# Patient Record
Sex: Female | Born: 1985 | Race: White | Hispanic: No | Marital: Single | Smoking: Never smoker
Health system: Southern US, Community
[De-identification: ages and names within clinical notes are randomized; demographics above are authoritative.]

## PROBLEM LIST (undated history)

## (undated) DIAGNOSIS — F411 Generalized anxiety disorder: Secondary | ICD-10-CM

## (undated) DIAGNOSIS — J309 Allergic rhinitis, unspecified: Secondary | ICD-10-CM

## (undated) DIAGNOSIS — R21 Rash and other nonspecific skin eruption: Secondary | ICD-10-CM

## (undated) DIAGNOSIS — F319 Bipolar disorder, unspecified: Secondary | ICD-10-CM

## (undated) DIAGNOSIS — J019 Acute sinusitis, unspecified: Secondary | ICD-10-CM

## (undated) HISTORY — DX: Generalized anxiety disorder: F41.1

## (undated) HISTORY — DX: Rash and other nonspecific skin eruption: R21

## (undated) HISTORY — DX: Bipolar disorder, unspecified: F31.9

## (undated) HISTORY — DX: Allergic rhinitis, unspecified: J30.9

## (undated) HISTORY — DX: Acute sinusitis, unspecified: J01.90

---
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
## 2005-01-11 ENCOUNTER — Ambulatory Visit: Payer: Self-pay | Admitting: Internal Medicine

## 2006-01-08 IMAGING — CR DG FOREARM 2V*R*
2 series · 2 of 2 positions shown · non-contrast
Comparison: none

CLINICAL DATA: Hit with softball in forearm with swelling. 
 RIGHT FOREARM, TWO VIEW ? 02/16/04
 There is no evidence of fracture or dislocation. No other significant bone or soft tissue abnormalities are identified.

 IMPRESSION
 Normal study.

[view not recorded (1 of 2)]
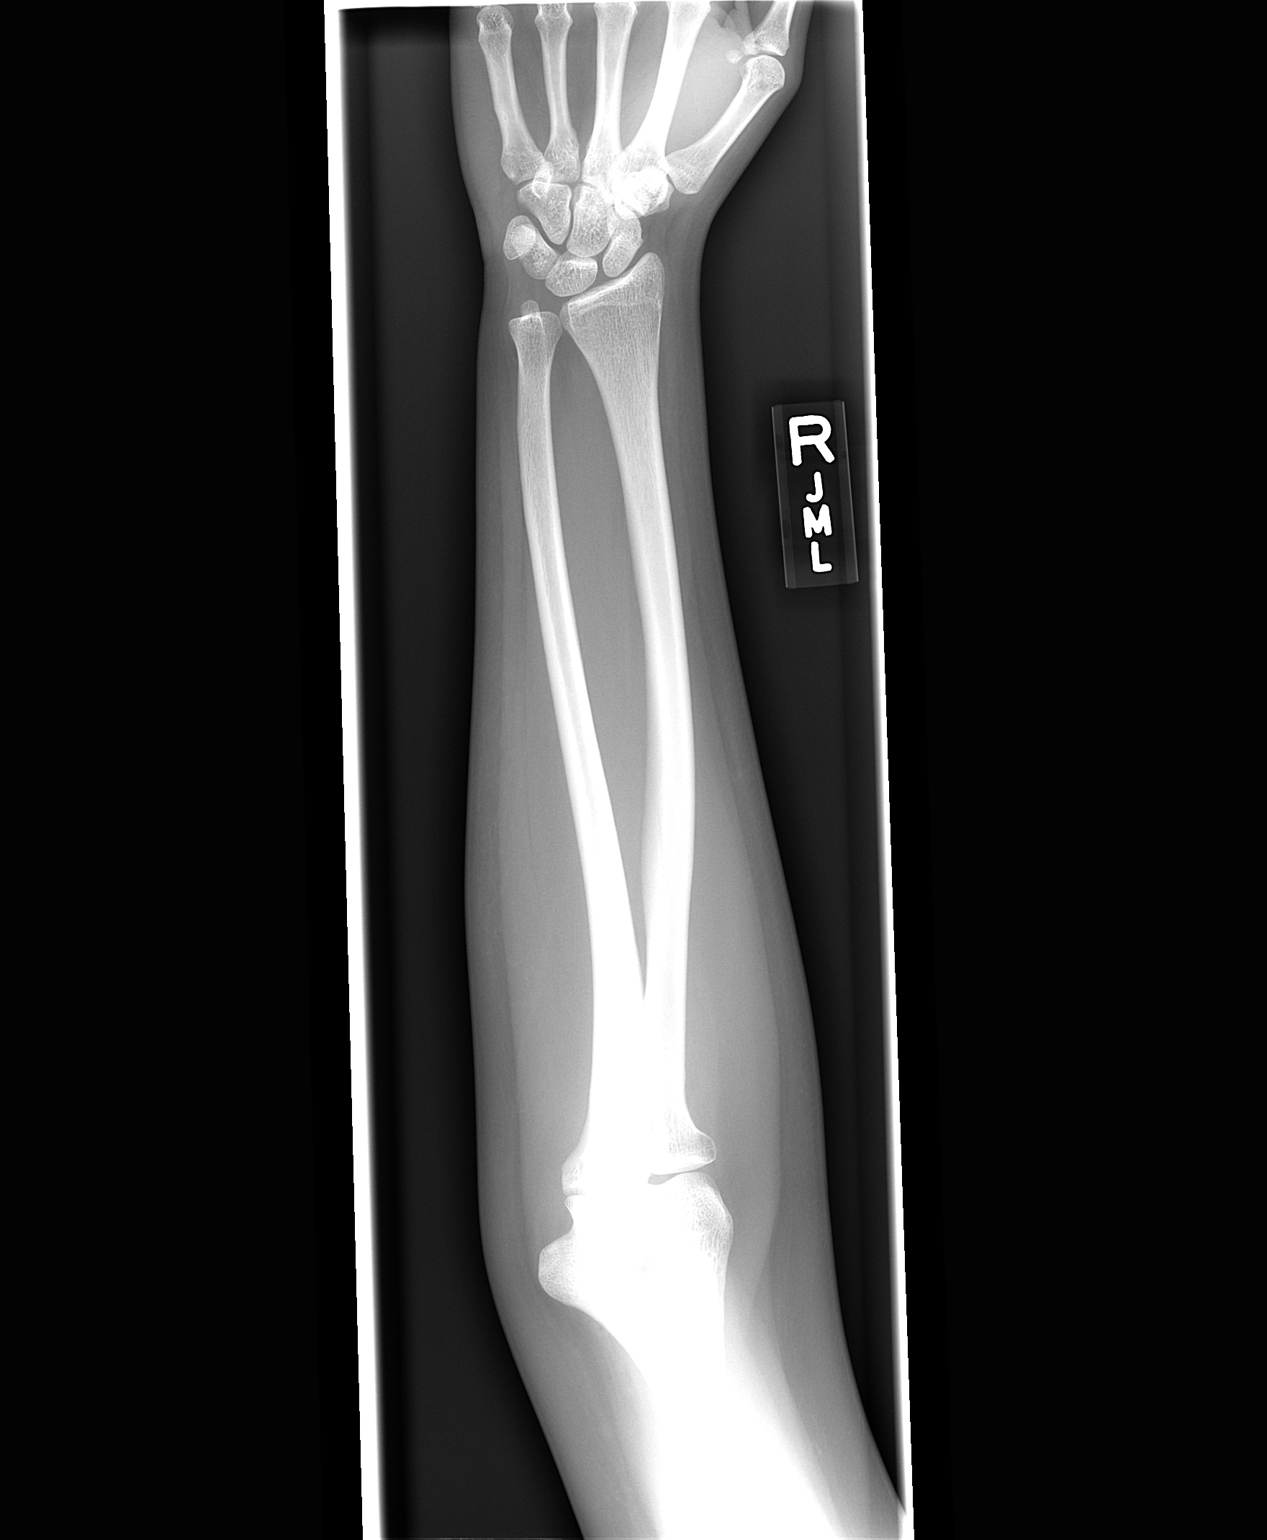

[view not recorded (2 of 2)]
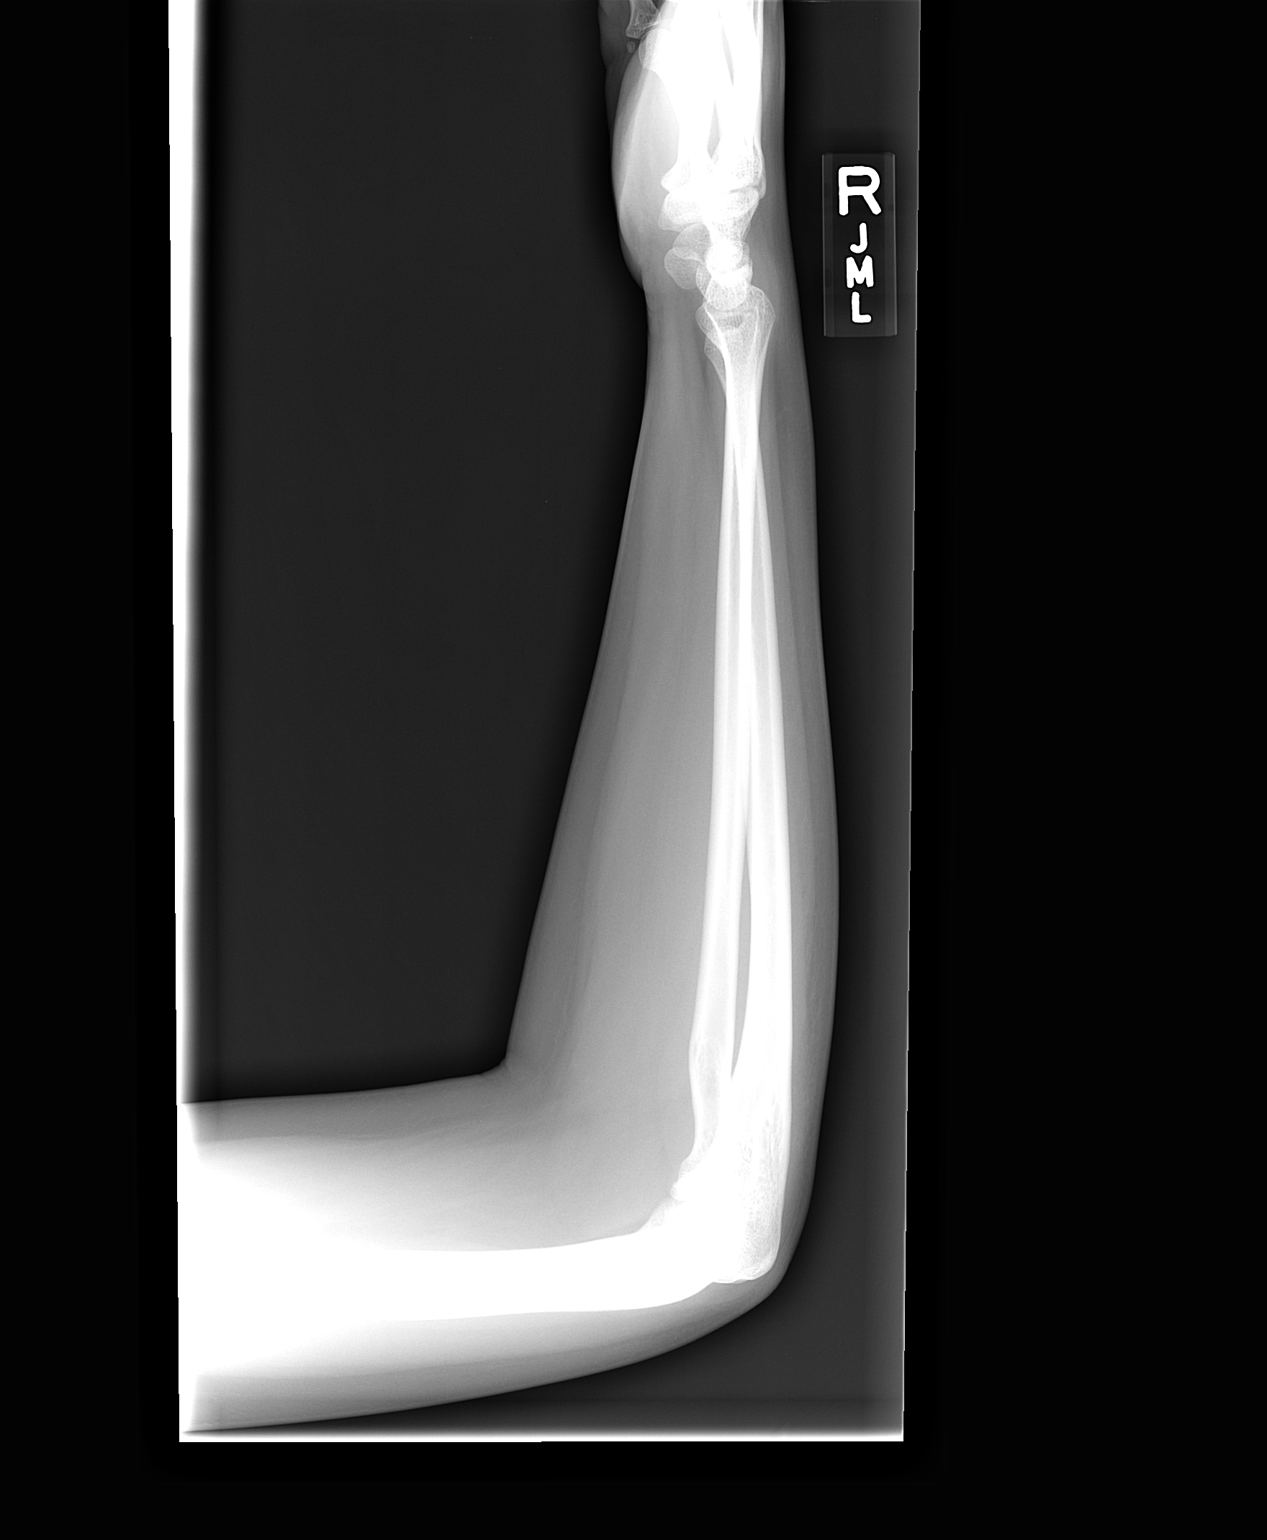

[2 of 2 positions shown; findings below may reference images not displayed]

## 2006-06-14 ENCOUNTER — Ambulatory Visit: Payer: Self-pay | Admitting: Internal Medicine

## 2008-06-18 ENCOUNTER — Ambulatory Visit: Payer: Self-pay | Admitting: Internal Medicine

## 2008-06-18 DIAGNOSIS — J309 Allergic rhinitis, unspecified: Secondary | ICD-10-CM

## 2008-06-18 DIAGNOSIS — F411 Generalized anxiety disorder: Secondary | ICD-10-CM

## 2008-06-18 DIAGNOSIS — R21 Rash and other nonspecific skin eruption: Secondary | ICD-10-CM | POA: Insufficient documentation

## 2008-06-18 HISTORY — DX: Allergic rhinitis, unspecified: J30.9

## 2008-06-18 HISTORY — DX: Generalized anxiety disorder: F41.1

## 2008-06-18 HISTORY — DX: Rash and other nonspecific skin eruption: R21

## 2008-12-24 ENCOUNTER — Ambulatory Visit: Payer: Self-pay | Admitting: Internal Medicine

## 2009-07-09 ENCOUNTER — Ambulatory Visit: Payer: Self-pay | Admitting: Internal Medicine

## 2009-07-09 DIAGNOSIS — J019 Acute sinusitis, unspecified: Secondary | ICD-10-CM

## 2009-07-09 HISTORY — DX: Acute sinusitis, unspecified: J01.90

## 2010-07-01 NOTE — Assessment & Plan Note (Signed)
Summary: sinus inf? cd   Vital Signs:  Patient profile:   25 year old female Height:      67 inches Weight:      164.38 pounds BMI:     25.84 O2 Sat:      97 % on Room air Temp:     98.8 degrees F oral Pulse rate:   126 / minute BP sitting:   112 / 76  (left arm) Cuff size:   regular  Vitals Entered ByZella Ball Ewing (July 09, 2009 2:19 PM)  O2 Flow:  Room air CC: pt c/o sinuses/pressure on left side of face radiating into teeth/congestion/re   CC:  pt c/o sinuses/pressure on left side of face radiating into teeth/congestion/re.  History of Present Illness: here with 2 wks grad increasing facial pain, pressure, fever and greenish d/c, with teeth pain to the left face and jaw area;  Pt denies CP, sob, doe, wheezing, orthopnea, pnd, worsening LE edema, palps, dizziness or syncope  Pt denies new neuro symptoms such as headache, facial or extremity weakness   Problems Prior to Update: 1)  Sinusitis- Acute-nos  (ICD-461.9) 2)  Allergic Rhinitis  (ICD-477.9) 3)  Anxiety  (ICD-300.00) 4)  Rash-nonvesicular  (ICD-782.1)  Medications Prior to Update: 1)  Alprazolam 1 Mg Tabs (Alprazolam) .... 1/2 - 1 By Mouth Three Times A Day As Needed 2)  Doxycycline Hyclate 100 Mg Caps (Doxycycline Hyclate) .Marland Kitchen.. 1po Two Times A Day 3)  Ofloxacin 0.3 % Soln (Ofloxacin) .... 2 Gtt Both Eyes Four Times Per Day For 10 Days  Current Medications (verified): 1)  Cephalexin 500 Mg Caps (Cephalexin) .Marland Kitchen.. 1po Three Times A Day  Allergies (verified): 1)  ! * Mycins  Past History:  Past Medical History: Last updated: 06/18/2008 Anxiety Bipolar illness Allergic rhinitis  Past Surgical History: Last updated: 06/18/2008 Denies surgical history  Social History: Last updated: 06/18/2008 Never Smoked Alcohol use-no Single to graduate [employer] spring 2010  Risk Factors: Smoking Status: never (06/18/2008)  Review of Systems       all otherwise negative per pt -  Physical Exam  General:   alert and well-developed.  , mild ill  Head:  normocephalic and atraumatic.   Eyes:  vision grossly intact, pupils equal, and pupils round.   Ears:  bilat tm's red, sinus tender bilat left > right Nose:  nasal dischargemucosal pallor and mucosal erythema.   Mouth:  pharyngeal erythema and fair dentition.   Neck:  supple and cervical lymphadenopathy.   Breasts:  no abnormal thickening.   Lungs:  normal respiratory effort and normal breath sounds.   Heart:  normal rate and regular rhythm.   Extremities:  no edema, no erythema    Impression & Recommendations:  Problem # 1:  SINUSITIS- ACUTE-NOS (ICD-461.9)  The following medications were removed from the medication list:    Doxycycline Hyclate 100 Mg Caps (Doxycycline hyclate) .Marland Kitchen... 1po two times a day Her updated medication list for this problem includes:    Cephalexin 500 Mg Caps (Cephalexin) .Marland Kitchen... 1po three times a day treat as above, f/u any worsening signs or symptoms   Complete Medication List: 1)  Cephalexin 500 Mg Caps (Cephalexin) .Marland Kitchen.. 1po three times a day  Patient Instructions: 1)  Please take all new medications as prescribed 2)  Continue all previous medications as before this visit  3)  Please schedule a follow-up appointment as needed. Prescriptions: CEPHALEXIN 500 MG CAPS (CEPHALEXIN) 1po three times a day  #30 x 0  Entered and Authorized by:   Corwin Levins MD   Signed by:   Corwin Levins MD on 07/09/2009   Method used:   Print then Give to Patient   RxID:   [id number] CLARITHROMYCIN 500 MG TABS (CLARITHROMYCIN) 1 by mouth two times a day  #20 x 0   Entered and Authorized by:   Corwin Levins MD   Signed by:   Corwin Levins MD on 07/09/2009   Method used:   Print then Give to Patient   RxID:   [id number]

## 2010-10-15 ENCOUNTER — Encounter: Payer: Self-pay | Admitting: Internal Medicine

## 2010-10-15 DIAGNOSIS — Z0000 Encounter for general adult medical examination without abnormal findings: Secondary | ICD-10-CM | POA: Insufficient documentation

## 2010-10-18 ENCOUNTER — Encounter: Payer: Self-pay | Admitting: Internal Medicine

## 2010-10-18 ENCOUNTER — Other Ambulatory Visit (INDEPENDENT_AMBULATORY_CARE_PROVIDER_SITE_OTHER): Payer: BC Managed Care – PPO

## 2010-10-18 ENCOUNTER — Ambulatory Visit (INDEPENDENT_AMBULATORY_CARE_PROVIDER_SITE_OTHER): Payer: BC Managed Care – PPO | Admitting: Internal Medicine

## 2010-10-18 VITALS — BP 90/62 | HR 82 | Temp 97.6°F | Ht 67.0 in | Wt 164.0 lb

## 2010-10-18 DIAGNOSIS — Z0000 Encounter for general adult medical examination without abnormal findings: Secondary | ICD-10-CM

## 2010-10-18 DIAGNOSIS — R233 Spontaneous ecchymoses: Secondary | ICD-10-CM | POA: Insufficient documentation

## 2010-10-18 DIAGNOSIS — R238 Other skin changes: Secondary | ICD-10-CM | POA: Insufficient documentation

## 2010-10-18 DIAGNOSIS — M7710 Lateral epicondylitis, unspecified elbow: Secondary | ICD-10-CM | POA: Insufficient documentation

## 2010-10-18 DIAGNOSIS — J309 Allergic rhinitis, unspecified: Secondary | ICD-10-CM

## 2010-10-18 LAB — URINALYSIS, ROUTINE W REFLEX MICROSCOPIC
Bilirubin Urine: NEGATIVE
Hgb urine dipstick: NEGATIVE
Nitrite: NEGATIVE
Total Protein, Urine: NEGATIVE
Urine Glucose: NEGATIVE
pH: 7.5 (ref 5.0–8.0)

## 2010-10-18 LAB — BASIC METABOLIC PANEL WITH GFR
BUN: 9 mg/dL (ref 6–23)
CO2: 28 meq/L (ref 19–32)
Calcium: 9.4 mg/dL (ref 8.4–10.5)
Chloride: 103 meq/L (ref 96–112)
Creatinine, Ser: 0.5 mg/dL (ref 0.4–1.2)
GFR: 156.58 mL/min
Glucose, Bld: 82 mg/dL (ref 70–99)
Potassium: 4.2 meq/L (ref 3.5–5.1)
Sodium: 138 meq/L (ref 135–145)

## 2010-10-18 LAB — PROTIME-INR
INR: 1.1 ratio — ABNORMAL HIGH (ref 0.8–1.0)
Prothrombin Time: 11.8 s (ref 10.2–12.4)

## 2010-10-18 LAB — CBC WITH DIFFERENTIAL/PLATELET
Basophils Absolute: 0 10*3/uL (ref 0.0–0.1)
Eosinophils Relative: 1.1 % (ref 0.0–5.0)
HCT: 37.9 % (ref 36.0–46.0)
Hemoglobin: 13.1 g/dL (ref 12.0–15.0)
Lymphocytes Relative: 24 % (ref 12.0–46.0)
Monocytes Relative: 7.2 % (ref 3.0–12.0)
Platelets: 186 10*3/uL (ref 150.0–400.0)
RDW: 12.8 % (ref 11.5–14.6)
WBC: 6 10*3/uL (ref 4.5–10.5)

## 2010-10-18 LAB — TSH: TSH: 1.14 u[IU]/mL (ref 0.35–5.50)

## 2010-10-18 LAB — HEPATIC FUNCTION PANEL
ALT: 14 U/L (ref 0–35)
AST: 16 U/L (ref 0–37)
Albumin: 4.1 g/dL (ref 3.5–5.2)
Alkaline Phosphatase: 50 U/L (ref 39–117)
Bilirubin, Direct: 0.1 mg/dL (ref 0.0–0.3)
Total Bilirubin: 1 mg/dL (ref 0.3–1.2)
Total Protein: 6.6 g/dL (ref 6.0–8.3)

## 2010-10-18 LAB — LIPID PANEL: Total CHOL/HDL Ratio: 2

## 2010-10-18 LAB — APTT: aPTT: 27.3 s (ref 21.7–28.8)

## 2010-10-18 MED ORDER — FEXOFENADINE HCL 180 MG PO TABS: 180.0000 mg | ORAL_TABLET | Freq: Every day | ORAL | Status: DC

## 2010-10-18 MED ORDER — FLUTICASONE PROPIONATE 50 MCG/ACT NA SUSP: 2.0000 | Freq: Every day | NASAL | Status: DC

## 2010-10-18 NOTE — Assessment & Plan Note (Signed)

## 2010-10-18 NOTE — Assessment & Plan Note (Signed)
Mild, ? Clinical signficance - for check coags and cbc, consider bleeding time

## 2010-10-18 NOTE — Assessment & Plan Note (Signed)
Mild to mod, for allegra and flonase asd,  to f/u any worsening symptoms or concerns

## 2010-10-18 NOTE — Patient Instructions (Signed)
Take all new medications as prescribed - OTC alleve as needed for pain, as well as the allegra and flonase for allergies Continue all other medications as before Please go to LAB in the Basement for the blood and/or urine tests to be done today Please call the phone number [phone] (the [employer] System) for results of testing in 2-3 days;  When calling, simply dial the number, and when prompted enter the MRN number above (the Medical Record Number) and the # key, then the message should start.

## 2010-10-18 NOTE — Assessment & Plan Note (Signed)
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

## 2010-10-18 NOTE — Progress Notes (Signed)
Subjective:    Patient ID: Cynthia Pham, female    DOB: Dec 04, 1985, 25 y.o.   MRN: [id number]  HPI  Here for wellness and f/u;  Overall doing ok;  Pt denies CP, worsening SOB, DOE, wheezing, orthopnea, PND, worsening LE edema, palpitations, dizziness or syncope.  Pt denies neurological change such as new Headache, facial or extremity weakness.  Pt denies polydipsia, polyuria, or low sugar symptoms. Pt states overall good compliance with treatment and medications, good tolerability, and trying to follow lower cholesterol diet.  Pt denies worsening depressive symptoms, suicidal ideation or panic. No fever, wt loss, night sweats, loss of appetite, or other constitutional symptoms.  Pt states good ability with ADL's, low fall risk, home safety reviewed and adequate, no significant changes in hearing or vision, and occasionally active with exercise.  Works at [employer] - c/o pain to the right elbow and wrist, better and worse, currently resolved, but may be worse after work.  Also with unusual for her bruising to extremities of which she was unaware of how this occurred, though no other bleeding such as gums or urine, BRBPR or joints.  Does have several wks ongoing nasal allergy symptoms with clear congestion, itch and sneeze, without fever, pain, ST, cough or wheezing. Past Medical History  Diagnosis Date  . ALLERGIC RHINITIS 06/18/2008  . ANXIETY 06/18/2008    bipolar  . RASH-NONVESICULAR 06/18/2008  . SINUSITIS- ACUTE-NOS 07/09/2009   No past surgical history on file.  reports that she has never smoked. She does not have any smokeless tobacco history on file. She reports that she does not drink alcohol. Her drug history not on file. family history includes ADD / ADHD in her mother and sister; Alcohol abuse in her other; Anxiety disorder in her mother and sister; Depression in her mother and sister; Diabetes in her other; Hyperlipidemia in her other; Hypertension in her other; and Stroke in  her other. Allergies  Allergen Reactions  . Azithromycin Hives, Diarrhea and Nausea Only   Current Outpatient Prescriptions on File Prior to Visit  Medication Sig Dispense Refill  . DISCONTD: cephALEXin (KEFLEX) 500 MG capsule Take 500 mg by mouth 3 (three) times daily.         Review of Systems Review of Systems  Constitutional: Negative for diaphoresis, activity change, appetite change and unexpected weight change.  HENT: Negative for hearing loss, ear pain, facial swelling, mouth sores and neck stiffness.   Eyes: Negative for pain, redness and visual disturbance.  Respiratory: Negative for shortness of breath and wheezing.   Cardiovascular: Negative for chest pain and palpitations.  Gastrointestinal: Negative for diarrhea, blood in stool, abdominal distention and rectal pain.  Genitourinary: Negative for hematuria, flank pain and decreased urine volume.  Musculoskeletal: Negative for myalgias and joint swelling.  Skin: Negative for color change and wound.  Neurological: Negative for syncope and numbness.  Hematological: Negative for adenopathy.  Psychiatric/Behavioral: Negative for hallucinations, self-injury, decreased concentration and agitation.      Objective:   Physical Exam BP 90/62  Pulse 82  Temp(Src) 97.6 F (36.4 C) (Oral)  Ht 5\' 7"  (1.702 m)  Wt 164 lb (74.39 kg)  BMI 25.69 kg/m2  SpO2 97%  LMP 10/07/2010 Physical Exam  VS noted Constitutional: Pt is oriented to person, place, and time. Appears well-developed and well-nourished.  HENT:  Head: Normocephalic and atraumatic.  Right Ear: External ear normal.  Left Ear: External ear normal.  Nose: Nose normal.  Mouth/Throat: Oropharynx is clear and moist.  Bilat tm's mild erythema.  Sinus nontender.  Pharynx mild erythema Eyes: Conjunctivae and EOM are normal. Pupils are equal, round, and reactive to light.  Neck: Normal range of motion. Neck supple. No JVD present. No tracheal deviation present.  Cardiovascular:  Normal rate, regular rhythm, normal heart sounds and intact distal pulses.   Pulmonary/Chest: Effort normal and breath sounds normal.  Abdominal: Soft. Bowel sounds are normal. There is no tenderness.  Musculoskeletal: Normal range of motion. Exhibits no edema. Right elbow and wrist NT, FROM, no swelling Lymphadenopathy:  Has no cervical adenopathy.  Neurological: Pt is alert and oriented to person, place, and time. Pt has normal reflexes. No cranial nerve deficit.  Skin: Skin is warm and dry. No rash noted. Several healing purplish-brown bruises noted to legs and arms Psychiatric:  Has  normal mood and affect. Behavior is normal. 1+ nervous        Assessment & Plan:

## 2011-07-15 ENCOUNTER — Encounter (INDEPENDENT_AMBULATORY_CARE_PROVIDER_SITE_OTHER): Payer: BC Managed Care – PPO | Admitting: *Deleted

## 2011-07-15 ENCOUNTER — Other Ambulatory Visit: Payer: Self-pay | Admitting: Cardiology

## 2011-07-15 ENCOUNTER — Encounter: Payer: Self-pay | Admitting: Internal Medicine

## 2011-07-15 ENCOUNTER — Ambulatory Visit (INDEPENDENT_AMBULATORY_CARE_PROVIDER_SITE_OTHER): Payer: BC Managed Care – PPO | Admitting: Internal Medicine

## 2011-07-15 ENCOUNTER — Telehealth: Payer: Self-pay | Admitting: Internal Medicine

## 2011-07-15 VITALS — BP 100/70 | HR 109 | Temp 98.3°F | Resp 18 | Wt 161.2 lb

## 2011-07-15 DIAGNOSIS — M79601 Pain in right arm: Secondary | ICD-10-CM | POA: Insufficient documentation

## 2011-07-15 DIAGNOSIS — F411 Generalized anxiety disorder: Secondary | ICD-10-CM

## 2011-07-15 DIAGNOSIS — M79609 Pain in unspecified limb: Secondary | ICD-10-CM

## 2011-07-15 DIAGNOSIS — M7989 Other specified soft tissue disorders: Secondary | ICD-10-CM

## 2011-07-15 MED ORDER — CEPHALEXIN 500 MG PO CAPS: 500.0000 mg | ORAL_CAPSULE | Freq: Four times a day (QID) | ORAL | Status: AC

## 2011-07-15 NOTE — Telephone Encounter (Signed)
Message copied by Corwin Levins on Fri Jul 15, 2011  8:58 PM ------      Message from: Burr Medico      Created: Fri Jul 15, 2011  3:01 PM      Regarding: Prelim Vascular       Venous duplex negative for DVT.

## 2011-07-15 NOTE — Telephone Encounter (Signed)
Prelim result - No DVT on doppler US RUE; robin to contact pt - ? Any improvement with antibx?

## 2011-07-15 NOTE — Patient Instructions (Signed)
You will be contacted regarding the referral for: Right arm doppler venous ultrasound  - to see the [facility] now Start Aspirin 81 mg  - 1 per day - COATED only Take all new medications as prescribed - the antibiotic Continue all other medications as before

## 2011-07-16 ENCOUNTER — Encounter: Payer: Self-pay | Admitting: Internal Medicine

## 2011-07-16 NOTE — Assessment & Plan Note (Signed)
Soreness of unclear etiology - ok for tylenol prn

## 2011-07-16 NOTE — Progress Notes (Signed)
Subjective:    Patient ID: Cynthia Pham, female    DOB: April 07, 1986, 26 y.o.   MRN: [id number]  HPI  Here to c/o achy and sore distal to elbow  RUE swelling for 2 days after having an IV to the post right hand for med related to wisdom teeth extraction on Tues feb 12, with some soreness to the more distal upper arm, but no pain/swelling to the shoudler or prox upper arm area.  Denies fever, trauma, joint swelling but also no erythema or obvious phebitic cords to the RUE overall.  Pt denies chest pain, increased sob or doe, wheezing, orthopnea, PND, increased LE swelling, palpitations, dizziness or syncope.  Pt denies new neurological symptoms such as new headache, or facial or extremity weakness or numbness  No neck pain.  Has some vague mild itchiness but no scratching or other angioedema, rash, tongue swelling.   Pt denies polydipsia, polyuria.  Denies worsening depressive symptoms, suicidal ideation, or panic, though has ongoing anxiety, not increased recently.  Past Medical History  Diagnosis Date  . ALLERGIC RHINITIS 06/18/2008  . ANXIETY 06/18/2008    bipolar  . RASH-NONVESICULAR 06/18/2008  . SINUSITIS- ACUTE-NOS 07/09/2009   No past surgical history on file.  reports that she has never smoked. She does not have any smokeless tobacco history on file. She reports that she does not drink alcohol. Her drug history not on file. family history includes ADD / ADHD in her mother and sister; Alcohol abuse in her other; Anxiety disorder in her mother and sister; Depression in her mother and sister; Diabetes in her other; Hyperlipidemia in her other; Hypertension in her other; and Stroke in her other. Allergies  Allergen Reactions  . Azithromycin Hives, Diarrhea and Nausea Only   Current Outpatient Prescriptions on File Prior to Visit  Medication Sig Dispense Refill  . fexofenadine (ALLEGRA) 180 MG tablet Take 1 tablet (180 mg total) by mouth daily.  30 tablet  2  . fluticasone (FLONASE) 50  MCG/ACT nasal spray 2 sprays by Nasal route daily.  16 g  2   Review of Systems Review of Systems  Constitutional: Negative for diaphoresis and unexpected weight change.  HENT: Negative for drooling and tinnitus.   Eyes: Negative for photophobia and visual disturbance.  Respiratory: Negative for choking and stridor.   Musculoskeletal: Negative for gait problem.  Skin: Negative for color change and wound.  Neurological: Negative for tremors and numbness.  Psychiatric/Behavioral: Negative for decreased concentration. The patient is not hyperactive.       Objective:   Physical Exam BP 100/70  Pulse 109  Temp(Src) 98.3 F (36.8 C) (Oral)  Resp 18  Wt 161 lb 4 oz (73.143 kg)  SpO2 98%  LMP 07/15/2011 Physical Exam  VS noted, not ill appearing Constitutional: Pt appears well-developed and well-nourished.  HENT: Head: Normocephalic.  Right Ear: External ear normal.  Left Ear: External ear normal.  Eyes: Conjunctivae and EOM are normal. Pupils are equal, round, and reactive to light.  Neck: Normal range of motion. Neck supple.  Cardiovascular: Normal rate and regular rhythm.   Pulmonary/Chest: Effort normal and breath sounds normal.  Neurological: Pt is alert. No cranial nerve deficit.  Skin: Skin is warm. No erythema.  Distal RUE with trace to 1+ diffuse swelling extending to just above the elbow level, NT, no erythema or scratches,  ? subq phlebitic area ant arm just distal to the antecubital area Right elbow NT, FROM, no effusion Psychiatric: Pt behavior is normal. Thought  content normal.     Assessment & Plan:

## 2011-07-16 NOTE — Assessment & Plan Note (Signed)
Etiology unclear, differential includes phelbitis either subq or dvt, angioedema or infectious;  Today for RUE venous doppler, empiric antibx course, tylenol prn, consider benedryl prn

## 2011-07-16 NOTE — Assessment & Plan Note (Signed)
stable overall by hx and exam, most recent data reviewed with pt, and pt to continue medical treatment as before  Lab Results  Component Value Date   WBC 6.0 10/18/2010   HGB 13.1 10/18/2010   HCT 37.9 10/18/2010   PLT 186.0 10/18/2010   GLUCOSE 82 10/18/2010   CHOL 127 10/18/2010   TRIG 51.0 10/18/2010   HDL 63.60 10/18/2010   LDLCALC 53 10/18/2010   ALT 14 10/18/2010   AST 16 10/18/2010   NA 138 10/18/2010   K 4.2 10/18/2010   CL 103 10/18/2010   CREATININE 0.5 10/18/2010   BUN 9 10/18/2010   CO2 28 10/18/2010   TSH 1.14 10/18/2010   INR 1.1* 10/18/2010

## 2011-07-19 NOTE — Telephone Encounter (Signed)
Called patient and swelling has gone down, she is doing better.

## 2014-02-20 ENCOUNTER — Ambulatory Visit: Payer: BC Managed Care – PPO | Admitting: Internal Medicine

## 2014-07-18 ENCOUNTER — Encounter: Payer: Self-pay | Admitting: Internal Medicine

## 2014-07-18 ENCOUNTER — Ambulatory Visit (INDEPENDENT_AMBULATORY_CARE_PROVIDER_SITE_OTHER): Payer: BLUE CROSS/BLUE SHIELD | Admitting: Internal Medicine

## 2014-07-18 VITALS — BP 128/80 | HR 88 | Temp 98.0°F | Ht 67.0 in | Wt 184.0 lb

## 2014-07-18 DIAGNOSIS — J309 Allergic rhinitis, unspecified: Secondary | ICD-10-CM

## 2014-07-18 DIAGNOSIS — F411 Generalized anxiety disorder: Secondary | ICD-10-CM

## 2014-07-18 DIAGNOSIS — Z23 Encounter for immunization: Secondary | ICD-10-CM

## 2014-07-18 MED ORDER — FLUTICASONE PROPIONATE 50 MCG/ACT NA SUSP: 2.0000 | Freq: Every day | NASAL | Status: DC

## 2014-07-18 MED ORDER — CETIRIZINE HCL 10 MG PO TABS: 10.0000 mg | ORAL_TABLET | Freq: Every day | ORAL | Status: DC

## 2014-07-18 MED ORDER — ALPRAZOLAM 0.5 MG PO TABS: 0.5000 mg | ORAL_TABLET | Freq: Every day | ORAL | Status: DC | PRN

## 2014-07-18 NOTE — Assessment & Plan Note (Signed)
Ok for xanax qd prn, limited use/limited rx only,  to f/u any worsening symptoms or concerns, declines ssri trial at this time

## 2014-07-18 NOTE — Progress Notes (Signed)
Pre visit review using our clinic review tool, if applicable. No additional management support is needed unless otherwise documented below in the visit note. 

## 2014-07-18 NOTE — Progress Notes (Signed)
   Subjective:    Patient ID: Cynthia Pham, female    DOB: 1985-07-30, 29 y.o.   MRN: [id number]  HPI    Here with c/o several years gradually worsening sinus congestion, now worse in last few wks - Does have several wks ongoing nasal allergy symptoms with clearish congestion, itch and sneezing, without fever, pain, ST, cough, swelling or wheezing. Worse in the AM with more mucous and bloody nose, sinus pressure sometime present later in the day as well, no fever but some pain ongoing about 75$ of time.  Tried advil cold and sinus, helps somewhat except for this past wk with more pain and blood, especially in the am.   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 ALLERGIC RHINITIS 06/18/2008  . ANXIETY 06/18/2008    bipolar  . RASH-NONVESICULAR 06/18/2008  . SINUSITIS- ACUTE-NOS 07/09/2009   No past surgical history on file.  reports that she has never smoked. She does not have any smokeless tobacco history on file. She reports that she does not drink alcohol. Her drug history is not on file. family history includes ADD / ADHD in her mother and sister; Alcohol abuse in her other; Anxiety disorder in her mother and sister; Depression in her mother and sister; Diabetes in her other; Hyperlipidemia in her other; Hypertension in her other; Stroke in her other. Allergies  Allergen Reactions  . Azithromycin Hives, Diarrhea and Nausea Only   No current outpatient prescriptions on file prior to visit.   No current facility-administered medications on file prior to visit.    Review of Systems  Constitutional: Negative for unusual diaphoresis or other sweats  HENT: Negative for ringing in ear Eyes: Negative for double vision or worsening visual disturbance.  Respiratory: Negative for choking  and stridor.   Gastrointestinal: Negative for vomiting or other signifcant bowel change Genitourinary: Negative for hematuria or decreased urine volume.  Musculoskeletal: Negative for other MSK pain or swelling Skin: Negative for color change and worsening wound.  Neurological: Negative for tremors and numbness other than noted  Psychiatric/Behavioral: Negative for decreased concentration or agitation other than above       Objective:   Physical Exam BP 128/80 mmHg  Pulse 88  Temp(Src) 98 F (36.7 C) (Oral)  Ht 5\' 7"  (1.702 m)  Wt 184 lb (83.462 kg)  BMI 28.81 kg/m2  SpO2 99%  LMP 07/15/2014 VS noted, non toxic Constitutional: Pt appears well-developed, well-nourished.  HENT: Head: NCAT.  Right Ear: External ear normal.  Left Ear: External ear normal.  Bilat tm's with mild erythema.  Max sinus areas non tender.  Pharynx with mild erythema, no exudate Eyes: . Pupils are equal, round, and reactive to light. Conjunctivae and EOM are normal Neck: Normal range of motion. Neck supple.  Cardiovascular: Normal rate and regular rhythm.   Pulmonary/Chest: Effort normal and breath sounds without rales or wheezing.  Neurological: Pt is alert. Not confused , motor grossly intact Skin: Skin is warm. No rash Psychiatric: Pt behavior is normal. No agitation. 1-2+ nervous, not depressed affect    Assessment & Plan:

## 2014-07-18 NOTE — Patient Instructions (Signed)
You had The tetanus shot today  Please take all new medication as prescribed - the zyrtec and flonase, as well as the xanax as needed only  Please continue all other medications as before, and refills have been done if requested.  Please have the pharmacy call with any other refills you may need.  Please keep your appointments with your specialists as you may have planned

## 2014-07-18 NOTE — Assessment & Plan Note (Signed)
Mild to mod worsening, no fever and afeb, exam wiithout s/s of infection, for zyrtec/flonase asd , consider allergy referral

## 2014-08-10 ENCOUNTER — Emergency Department (HOSPITAL_COMMUNITY)
Admission: EM | Admit: 2014-08-10 | Discharge: 2014-08-10 | Disposition: A | Discharge status: Home / Self Care | Payer: BLUE CROSS/BLUE SHIELD | Source: Home / Self Care | Attending: Family Medicine | Admitting: Family Medicine

## 2014-08-10 ENCOUNTER — Encounter (HOSPITAL_COMMUNITY): Payer: Self-pay | Admitting: *Deleted

## 2014-08-10 DIAGNOSIS — A084 Viral intestinal infection, unspecified: Secondary | ICD-10-CM

## 2014-08-10 MED ORDER — ONDANSETRON 4 MG PO TBDP
8.0000 mg | ORAL_TABLET | Freq: Once | ORAL | Status: AC
  Administered 2014-08-10: 8 mg via ORAL

## 2014-08-10 MED ORDER — PROMETHAZINE HCL 25 MG PO TABS: 25.0000 mg | ORAL_TABLET | Freq: Four times a day (QID) | ORAL | Status: DC | PRN

## 2014-08-10 MED ORDER — ONDANSETRON 4 MG PO TBDP
ORAL_TABLET | ORAL | Status: AC
  Filled 2014-08-10: qty 2

## 2014-08-10 NOTE — Discharge Instructions (Signed)
Thank you for coming in today. If your belly pain worsens, or you have high fever, bad vomiting, blood in your stool or black tarry stool go to the Emergency Room.  Drink plenty of fluids.  Take phenergan as needed for vomiting. It will make you sleepy so do not drive after taking this medicine.   'Viral Gastroenteritis Viral gastroenteritis is also known as stomach flu. This condition affects the stomach and intestinal tract. It can cause sudden diarrhea and vomiting. The illness typically lasts 3 to 8 days. Most people develop an immune response that eventually gets rid of the virus. While this natural response develops, the virus can make you quite ill. CAUSES  Many different viruses can cause gastroenteritis, such as rotavirus or noroviruses. You can catch one of these viruses by consuming contaminated food or water. You may also catch a virus by sharing utensils or other personal items with an infected person or by touching a contaminated surface. SYMPTOMS  The most common symptoms are diarrhea and vomiting. These problems can cause a severe loss of body fluids (dehydration) and a body salt (electrolyte) imbalance. Other symptoms may include:  Fever.  Headache.  Fatigue.  Abdominal pain. DIAGNOSIS  Your caregiver can usually diagnose viral gastroenteritis based on your symptoms and a physical exam. A stool sample may also be taken to test for the presence of viruses or other infections. TREATMENT  This illness typically goes away on its own. Treatments are aimed at rehydration. The most serious cases of viral gastroenteritis involve vomiting so severely that you are not able to keep fluids down. In these cases, fluids must be given through an intravenous line (IV). HOME CARE INSTRUCTIONS   Drink enough fluids to keep your urine clear or pale yellow. Drink small amounts of fluids frequently and increase the amounts as tolerated.  Ask your caregiver for specific rehydration  instructions.  Avoid:  Foods high in sugar.  Alcohol.  Carbonated drinks.  Tobacco.  Juice.  Caffeine drinks.  Extremely hot or cold fluids.  Fatty, greasy foods.  Too much intake of anything at one time.  Dairy products until 24 to 48 hours after diarrhea stops.  You may consume probiotics. Probiotics are active cultures of beneficial bacteria. They may lessen the amount and number of diarrheal stools in adults. Probiotics can be found in yogurt with active cultures and in supplements.  Wash your hands well to avoid spreading the virus.  Only take over-the-counter or prescription medicines for pain, discomfort, or fever as directed by your caregiver. Do not give aspirin to children. Antidiarrheal medicines are not recommended.  Ask your caregiver if you should continue to take your regular prescribed and over-the-counter medicines.  Keep all follow-up appointments as directed by your caregiver. SEEK IMMEDIATE MEDICAL CARE IF:   You are unable to keep fluids down.  You do not urinate at least once every 6 to 8 hours.  You develop shortness of breath.  You notice blood in your stool or vomit. This may look like coffee grounds.  You have abdominal pain that increases or is concentrated in one small area (localized).  You have persistent vomiting or diarrhea.  You have a fever.  The patient is a child younger than 3 months, and he or she has a fever.  The patient is a child older than 3 months, and he or she has a fever and persistent symptoms.  The patient is a child older than 3 months, and he or she has a  fever and symptoms suddenly get worse.  The patient is a baby, and he or she has no tears when crying. MAKE SURE YOU:   Understand these instructions.  Will watch your condition.  Will get help right away if you are not doing well or get worse. Document Released: 05/16/2005 Document Revised: 08/08/2011 Document Reviewed: 03/02/2011 [facility] Patient  Information 2015 [place], [place]. This information is not intended to replace advice given to you by your health care provider. Make sure you discuss any questions you have with your health care provider.

## 2014-08-10 NOTE — ED Provider Notes (Signed)
Marcella DubsCaitlin A Eder is a 29 y.o. female who presents to Urgent Care today for Vomiting and diarrhea present for 3 days. Last episode of vomiting was 2 days ago. Patient continues to be nauseated. She has watery nonbloody diarrhea. She denies any abdominal pain fevers or chills. She has tried some Dramamine which helped a little. She is able to drink normally but is having difficulty eating because it tends to cause her diarrhea. No urinary symptoms. Last menstrual period is current. Not sexually active.   Past Medical History  Diagnosis Date  . ALLERGIC RHINITIS 06/18/2008  . ANXIETY 06/18/2008    bipolar  . RASH-NONVESICULAR 06/18/2008  . SINUSITIS- ACUTE-NOS 07/09/2009   History reviewed. No pertinent past surgical history. History  Substance Use Topics  . Smoking status: Never Smoker   . Smokeless tobacco: Not on file  . Alcohol Use: Yes     Comment: occasional   ROS as above Medications: Current Facility-Administered Medications  Medication Dose Route Frequency Provider Last Rate Last Dose  . ondansetron (ZOFRAN-ODT) disintegrating tablet 8 mg  8 mg Oral Once Rodolph BongEvan S Amiee Wiley, MD       Current Outpatient Prescriptions  Medication Sig Dispense Refill  . ALPRAZolam (XANAX) 0.5 MG tablet Take 1 tablet (0.5 mg total) by mouth daily as needed for anxiety. 30 tablet 2  . cetirizine (ZYRTEC) 10 MG tablet Take 1 tablet (10 mg total) by mouth daily. 30 tablet 11  . DimenhyDRINATE (DRAMAMINE PO) Take by mouth.    . fluticasone (FLONASE) 50 MCG/ACT nasal spray Place 2 sprays into both nostrils daily. 16 g 2  . Pseudoephedrine-Ibuprofen (ADVIL COLD/SINUS PO) Take by mouth.    . promethazine (PHENERGAN) 25 MG tablet Take 1 tablet (25 mg total) by mouth every 6 (six) hours as needed for nausea or vomiting. 20 tablet 0   Allergies  Allergen Reactions  . Azithromycin Hives, Diarrhea and Nausea Only     Exam:  BP 127/84 mmHg  Pulse 104  Temp(Src) 98.7 F (37.1 C) (Oral)  Resp 18  SpO2 98%  LMP  08/07/2014 (Exact Date) Gen: Well NAD nontoxic appearing HEENT: EOMI,  MMM Lungs: Normal work of breathing. CTABL Heart: mild tachycardia no MRG Abd: NABS, Soft. Nondistended, Nontender no rebound or guarding Exts: Brisk capillary refill, warm and well perfused.   Patient was given 8 mg of Zofran ODT prior to discharge  No results found for this or any previous visit (from the past 24 hour(s)). No results found.  Assessment and Plan: 29 y.o. female with viral gastroenteritis. Prescribed Phenergan. Use over-the-counter Imodium. Continue oral hydration. Follow-up as needed.  Discussed warning signs or symptoms. Please see discharge instructions. Patient expresses understanding.     Rodolph BongEvan S Athen Riel, MD 08/10/14 [phone]

## 2014-08-10 NOTE — ED Notes (Signed)
Started with n/v/d Thursday night.  Has had multiple episodes of watery diarrhea.  Stopped vomiting Friday afternoon, felt she was starting to improve, then nausea and watery diarrhea frequency increased last night again.  Has had intermittent lower abd dull achiness - "up higher than my normal menstrual cramps".  Had temps around 99 Friday.  Has been taking Advil and Dramamine without significant relief.  Has decreased PO fluid intake due to constant diarrhea.

## 2014-11-26 ENCOUNTER — Other Ambulatory Visit: Payer: Self-pay | Admitting: Internal Medicine

## 2015-07-01 ENCOUNTER — Ambulatory Visit: Payer: BLUE CROSS/BLUE SHIELD | Admitting: Internal Medicine

## 2015-07-08 ENCOUNTER — Ambulatory Visit (INDEPENDENT_AMBULATORY_CARE_PROVIDER_SITE_OTHER): Payer: BLUE CROSS/BLUE SHIELD | Admitting: Internal Medicine

## 2015-07-08 ENCOUNTER — Encounter: Payer: Self-pay | Admitting: Internal Medicine

## 2015-07-08 VITALS — BP 118/82 | HR 103 | Temp 98.3°F | Resp 20 | Ht 67.0 in | Wt 176.0 lb

## 2015-07-08 DIAGNOSIS — R5383 Other fatigue: Secondary | ICD-10-CM | POA: Diagnosis not present

## 2015-07-08 DIAGNOSIS — M549 Dorsalgia, unspecified: Secondary | ICD-10-CM

## 2015-07-08 DIAGNOSIS — F319 Bipolar disorder, unspecified: Secondary | ICD-10-CM | POA: Diagnosis not present

## 2015-07-08 DIAGNOSIS — E669 Obesity, unspecified: Secondary | ICD-10-CM | POA: Diagnosis not present

## 2015-07-08 HISTORY — DX: Bipolar disorder, unspecified: F31.9

## 2015-07-08 MED ORDER — CYCLOBENZAPRINE HCL 5 MG PO TABS: 5.0000 mg | ORAL_TABLET | Freq: Three times a day (TID) | ORAL | Status: DC | PRN

## 2015-07-08 MED ORDER — NAPROXEN 500 MG PO TABS: 500.0000 mg | ORAL_TABLET | Freq: Two times a day (BID) | ORAL | Status: DC

## 2015-07-08 NOTE — Progress Notes (Signed)
Pre visit review using our clinic review tool, if applicable. No additional management support is needed unless otherwise documented below in the visit note. 

## 2015-07-08 NOTE — Progress Notes (Signed)
Subjective:    Patient ID: Cynthia Pham, female    DOB: 02/16/1986, 30 y.o.   MRN: [id number]  HPI   Here with pain to the right periscapular area, mild, sharp x 8 days, worse to lift heavier objects over 20 lbs with right arm, better to rest, not clear how started as no trauma or falls and no other heavy lifting or new exercise; no neck/shoulder/arm pain, Pt denies chest pain, increased sob or doe, wheezing, orthopnea, PND, increased LE swelling, palpitations, dizziness or syncope.  Pt denies new neurological symptoms such as new headache, or facial or extremity weakness or numbness   Pt denies polydipsia, polyuria.  Has also new dx Bipolar I , but recent initial tx seemed to lead to fatigue and mental fogginess, and plans to cont tx with the local [facility], cont's to see nurse and therapist, next appt tomorrow. Etiology unclear, Does c/o ongoing fatigue, but denies signficant daytime hypersomnolence.  Declines flu shot.   Hard to keep wt controlled, but plans to be more active and cont to work on wt loss Wt Readings from Last 3 Encounters:  07/08/15 176 lb (79.833 kg)  07/18/14 184 lb (83.462 kg)  07/15/11 161 lb 4 oz (73.143 kg)   Past Medical History  Diagnosis Date  . ALLERGIC RHINITIS 06/18/2008  . ANXIETY 06/18/2008    bipolar  . RASH-NONVESICULAR 06/18/2008  . SINUSITIS- ACUTE-NOS 07/09/2009  . Bipolar 1 disorder (HCC) 07/08/2015   No past surgical history on file.  reports that she has never smoked. She does not have any smokeless tobacco history on file. She reports that she drinks alcohol. She reports that she does not use illicit drugs. family history includes ADD / ADHD in her mother and sister; Alcohol abuse in her other; Anxiety disorder in her mother and sister; Depression in her mother and sister; Diabetes in her other; Hyperlipidemia in her other; Hypertension in her other; Stroke in her other. Allergies  Allergen Reactions  . Azithromycin Hives, Diarrhea and  Nausea Only   Current Outpatient Prescriptions on File Prior to Visit  Medication Sig Dispense Refill  . cetirizine (ZYRTEC) 10 MG tablet Take 1 tablet (10 mg total) by mouth daily. 30 tablet 11  . fluticasone (FLONASE) 50 MCG/ACT nasal spray PLACE 2 SPRAYS INTO BOTH NOSTRILS DAILY. 16 g 2  . Pseudoephedrine-Ibuprofen (ADVIL COLD/SINUS PO) Take by mouth.     No current facility-administered medications on file prior to visit.    Review of Systems  Constitutional: Negative for unusual diaphoresis or night sweats HENT: Negative for ringing in ear or discharge Eyes: Negative for double vision or worsening visual disturbance.  Respiratory: Negative for choking and stridor.   Gastrointestinal: Negative for vomiting or other signifcant bowel change Genitourinary: Negative for hematuria or change in urine volume.  Musculoskeletal: Negative for other MSK pain or swelling Skin: Negative for color change and worsening wound.  Neurological: Negative for tremors and numbness other than noted  Psychiatric/Behavioral: Negative for decreased concentration or agitation other than above       Objective:   Physical Exam BP 118/82 mmHg  Pulse 103  Temp(Src) 98.3 F (36.8 C) (Oral)  Resp 20  Ht  (1.702 m)  Wt 176 lb (79.833 kg)  BMI 27.56 kg/m2  SpO2 99% VS noted, non toxic, not ill appearing Constitutional: Pt appears in no significant distress HENT: Head: NCAT.  Right Ear: External ear normal.  Left Ear: External ear normal.  Eyes: . Pupils  are equal, round, and reactive to light. Conjunctivae and EOM are normal Neck: Normal range of motion. Neck supple.  Cardiovascular: Normal rate and regular rhythm.   Pulmonary/Chest: Effort normal and breath sounds without rales or wheezing.  Abd:  Soft, NT, ND, + BS Spine: nontender Has an area right parathoracic/periscapular level tender spasm, without swelling or overlying skin change Neurological: Pt is alert. Not confused , motor grossly  intact Skin: Skin is warm. No rash, no LE edema Psychiatric: Pt behavior is normal. No agitation.     Assessment & Plan:

## 2015-07-08 NOTE — Patient Instructions (Addendum)
Please take all new medication as prescribed - the anti-inflammatory, and muscle relaxer  Please continue all other medications as before, and refills have been done if requested.  Please have the pharmacy call with any other refills you may need.  Please continue your efforts at being more active, low cholesterol diet, and weight control.  Please keep your appointments with your specialists as you may have planned - the therapist tomorrow  Please go to the LAB in the Basement (turn left off the elevator) for the tests to be done today  You will be contacted by phone if any changes need to be made immediately.  Otherwise, you will receive a letter about your results with an explanation, but please check with MyChart first.  Please remember to sign up for MyChart if you have not done so, as this will be important to you in the future with finding out test results, communicating by private email, and scheduling acute appointments online when needed.  Please return in 6 months, or sooner if needed

## 2015-07-10 ENCOUNTER — Other Ambulatory Visit (INDEPENDENT_AMBULATORY_CARE_PROVIDER_SITE_OTHER): Payer: BLUE CROSS/BLUE SHIELD

## 2015-07-10 DIAGNOSIS — F319 Bipolar disorder, unspecified: Secondary | ICD-10-CM | POA: Diagnosis not present

## 2015-07-10 DIAGNOSIS — E669 Obesity, unspecified: Secondary | ICD-10-CM | POA: Diagnosis not present

## 2015-07-10 DIAGNOSIS — R5383 Other fatigue: Secondary | ICD-10-CM | POA: Diagnosis not present

## 2015-07-10 LAB — URINALYSIS, ROUTINE W REFLEX MICROSCOPIC
Bilirubin Urine: NEGATIVE
Hgb urine dipstick: NEGATIVE
Ketones, ur: NEGATIVE
Nitrite: NEGATIVE
PH: 7 (ref 5.0–8.0)
RBC / HPF: NONE SEEN (ref 0–?)
Total Protein, Urine: NEGATIVE
URINE GLUCOSE: NEGATIVE
UROBILINOGEN UA: 0.2 (ref 0.0–1.0)

## 2015-07-10 LAB — HEPATIC FUNCTION PANEL
ALBUMIN: 4.6 g/dL (ref 3.5–5.2)
ALK PHOS: 52 U/L (ref 39–117)
ALT: 12 U/L (ref 0–35)
AST: 15 U/L (ref 0–37)
BILIRUBIN DIRECT: 0.2 mg/dL (ref 0.0–0.3)
TOTAL PROTEIN: 7.7 g/dL (ref 6.0–8.3)
Total Bilirubin: 1.1 mg/dL (ref 0.2–1.2)

## 2015-07-10 LAB — LIPID PANEL
CHOLESTEROL: 115 mg/dL (ref 0–200)
HDL: 50.1 mg/dL (ref >39.00)
LDL CALC: 53 mg/dL (ref 0–99)
NonHDL: 65.19
TRIGLYCERIDES: 62 mg/dL (ref 0.0–149.0)
Total CHOL/HDL Ratio: 2
VLDL: 12.4 mg/dL (ref 0.0–40.0)

## 2015-07-10 LAB — BASIC METABOLIC PANEL
BUN: 8 mg/dL (ref 6–23)
CALCIUM: 9.8 mg/dL (ref 8.4–10.5)
CO2: 26 meq/L (ref 19–32)
CREATININE: 0.62 mg/dL (ref 0.40–1.20)
Chloride: 106 mEq/L (ref 96–112)
GFR: 120.62 mL/min (ref >60.00)
GLUCOSE: 104 mg/dL — AB (ref 70–99)
Potassium: 4.9 mEq/L (ref 3.5–5.1)
Sodium: 139 mEq/L (ref 135–145)

## 2015-07-10 LAB — TSH: TSH: 1.44 u[IU]/mL (ref 0.35–4.50)

## 2015-07-11 ENCOUNTER — Encounter: Payer: Self-pay | Admitting: Internal Medicine

## 2015-07-11 LAB — CBC WITH DIFFERENTIAL/PLATELET
BASOS ABS: 0 10*3/uL (ref 0.0–0.1)
Basophils Relative: 0.3 % (ref 0.0–3.0)
EOS ABS: 0.1 10*3/uL (ref 0.0–0.7)
Eosinophils Relative: 1.8 % (ref 0.0–5.0)
HCT: 38.1 % (ref 36.0–46.0)
Hemoglobin: 12 g/dL (ref 12.0–15.0)
LYMPHS ABS: 1.4 10*3/uL (ref 0.7–4.0)
Lymphocytes Relative: 23.9 % (ref 12.0–46.0)
MCHC: 31.5 g/dL (ref 30.0–36.0)
MCV: 90.7 fl (ref 78.0–100.0)
MONO ABS: 0.5 10*3/uL (ref 0.1–1.0)
Monocytes Relative: 8.3 % (ref 3.0–12.0)
NEUTROS ABS: 4 10*3/uL (ref 1.4–7.7)
NEUTROS PCT: 65.7 % (ref 43.0–77.0)
PLATELETS: 214 10*3/uL (ref 150.0–400.0)
RBC: 4.2 Mil/uL (ref 3.87–5.11)
RDW: 13.8 % (ref 11.5–15.5)
WBC: 6 10*3/uL (ref 4.0–10.5)

## 2015-07-12 NOTE — Assessment & Plan Note (Signed)
For increased exercise , less calories and cont'd wt control efforts

## 2015-07-12 NOTE — Assessment & Plan Note (Signed)
D/w pt and mother, plans to fu with therapist in am

## 2015-07-12 NOTE — Assessment & Plan Note (Signed)
Etiology unclear, Exam otherwise benign, to check labs as documented, follow with expectant management  

## 2015-07-12 NOTE — Assessment & Plan Note (Signed)
Most c/w msk strain though etiology not obvious, for nsaid prn, muscle relaxer, consider imaging for any worsening pain, radicular symptoms, fever or other unusual symptoms

## 2015-08-29 ENCOUNTER — Emergency Department (HOSPITAL_COMMUNITY)
Admission: EM | Admit: 2015-08-29 | Discharge: 2015-08-29 | Disposition: A | Discharge status: Home / Self Care | Payer: BLUE CROSS/BLUE SHIELD | Source: Home / Self Care | Attending: Emergency Medicine | Admitting: Emergency Medicine

## 2015-08-29 ENCOUNTER — Encounter (HOSPITAL_COMMUNITY): Payer: Self-pay | Admitting: Emergency Medicine

## 2015-08-29 DIAGNOSIS — T4995XA Adverse effect of unspecified topical agent, initial encounter: Secondary | ICD-10-CM

## 2015-08-29 NOTE — ED Notes (Signed)
Patient c/o possible allergic reaction to Lamictal. She reports she has been on a taper increasing it by 25 mg every week for 5 weeks. She is currently experiencing purple discoloration in her legs and redness. Patient denies any shortness of breath. Patient is in NAD.

## 2015-08-29 NOTE — Discharge Instructions (Signed)
Drug Allergy °A drug allergy means you have a strange reaction to a medicine. You may have puffiness (swelling), itching, red rashes, and hives. Some allergic reactions can be life-threatening. °HOME CARE  °If you do not know what caused your reaction: °· Write down medicines you use. °· Write down any problems you have after using medicine. °· Avoid things that cause a reaction. °· You can see an allergy doctor to be tested for allergies. °If you have hives or a rash: °· Take medicine as told by your doctor. °· Place cold cloths on your skin. °· Do not take hot baths or hot showers. Take baths in cool water. °If you are severely allergic: °· Wear a medical bracelet or necklace that lists your allergy. °· Carry your allergy kit or medicine shot to treat severe allergic reactions with you. These can save your life. °· Do not drive until medicine from your shot has worn off, unless your doctor says it is okay. °GET HELP RIGHT AWAY IF:  °· Your mouth is puffy, or you have trouble breathing. °· You have a tight feeling in your chest or throat. °· You have hives, puffiness, or itching all over your body. °· You throw up (vomit) or have watery poop (diarrhea). °· You feel dizzy or pass out (faint). °· You think you are having a reaction. Problems often start within 30 minutes after taking a medicine. °· You are getting worse, not better. °· You have new problems. °· Your problems go away and then come back. °This is an emergency. Use your medicine shot or allergy kit as told. Call your local emergency services (911 in [place] after the shot. Even if you feel better after the shot, you need to go to the hospital. You may need more medicine to control a severe reaction. °MAKE SURE YOU: °· Understand these instructions. °· Will watch your condition. °· Will get help right away if you are not doing well or get worse. °  °This information is not intended to replace advice given to you by your health care provider. Make sure you  discuss any questions you have with your health care provider. °  °Document Released: 06/23/2004 Document Revised: 08/08/2011 Document Reviewed: 12/16/2014 °Elsevier Interactive Patient Education ©2016 [employer] ° °

## 2015-08-29 NOTE — ED Provider Notes (Signed)
CSN: [id number]     Arrival date & time 08/29/15  1403 History   First MD Initiated Contact with Patient 08/29/15 1441     Chief Complaint  Patient presents with  . Allergic Reaction   (Consider location/radiation/quality/duration/timing/severity/associated sxs/prior Treatment) HPI Pt was in the sun yesterday and developed a purple nontender rash on the medial aspect of both thighs. 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 No home treatment, no fever, no pain.  Past Medical History  Diagnosis Date  . ALLERGIC RHINITIS 06/18/2008  . ANXIETY 06/18/2008    bipolar  . RASH-NONVESICULAR 06/18/2008  . SINUSITIS- ACUTE-NOS 07/09/2009  . Bipolar 1 disorder (HCC) 07/08/2015   History reviewed. No pertinent past surgical history. Family History  Problem Relation Age of Onset  . ADD / ADHD Mother   . Depression Mother   . Anxiety disorder Mother   . ADD / ADHD Sister   . Depression Sister   . Anxiety disorder Sister   . Alcohol abuse Other     ETOH  . Diabetes Other   . Hypertension Other   . Hyperlipidemia Other   . Stroke Other    Social History  Substance Use Topics  . Smoking status: Never Smoker   . Smokeless tobacco: None  . Alcohol Use: Yes     Comment: occasional   OB History    No data available     Review of Systems Rash on legs. Allergies  Azithromycin  Home Medications   Prior to Admission medications   Medication Sig Start Date End Date Taking? Authorizing Provider  ALPRAZolam Prudy Feeler) 0.5 MG tablet Take 0.5 mg by mouth at bedtime as needed for anxiety.   Yes Historical Provider, MD  lamoTRIgine (LAMICTAL) 25 MG tablet Take 25 mg by mouth daily. 4 pills daily 100 mg   Yes Historical Provider, MD  cetirizine (ZYRTEC) 10 MG tablet Take 1 tablet (10 mg total) by mouth daily. 07/18/14   Corwin Levins, MD  cyclobenzaprine (FLEXERIL) 5 MG tablet Take 1 tablet (5 mg total) by mouth 3 (three) times daily as needed for muscle spasms.  07/08/15   Corwin Levins, MD  fluticasone (FLONASE) 50 MCG/ACT nasal spray PLACE 2 SPRAYS INTO BOTH NOSTRILS DAILY. 11/26/14   Corwin Levins, MD  naproxen (NAPROSYN) 500 MG tablet Take 1 tablet (500 mg total) by mouth 2 (two) times daily with a meal. 07/08/15   Corwin Levins, MD  Pseudoephedrine-Ibuprofen (ADVIL COLD/SINUS PO) Take by mouth.    Historical Provider, MD   Meds Ordered and Administered this Visit  Medications - No data to display  BP 135/77 mmHg  Pulse 112  Temp(Src) 99.1 F (37.3 C) (Oral)  Resp 16  SpO2 100%  LMP 07/29/2015 (Approximate) No data found.   Physical Exam NURSES NOTES AND VITAL SIGNS REVIEWED. CONSTITUTIONAL: Well developed, well nourished, no acute distress HEENT: normocephalic, atraumatic EYES: Conjunctiva normal NECK:normal ROM, supple, no adenopathy PULMONARY:No respiratory distress, normal effort MUSCULOSKELETAL: Normal ROM of all extremities,  SKIN: warm and dry without rash there are purple streaks on the medial aspect of both thighs. There are no signs of stevens johnson syndrome.  PSYCHIATRIC: Mood and affect, behavior are normal    Procedures (including critical care time)  Labs Review Labs Reviewed - No data to display  Imaging Review No results found.   Visual Acuity Review  Right Eye Distance:   Left Eye Distance:   Bilateral Distance:  Right Eye Near:   Left Eye Near:    Bilateral Near:         MDM   1. Adverse reaction to drug that acts primarily on skin, initial encounter    Pt is advised to speak with her medication nurse.   She is also free to attend the ED if she becomes more worried about rash.    Tharon AquasFrank C Phillipa Morden, PA 08/29/15 [phone]

## 2016-07-21 ENCOUNTER — Other Ambulatory Visit: Payer: Self-pay | Admitting: Internal Medicine

## 2016-07-21 NOTE — Telephone Encounter (Signed)
Ok for one month  Please ask pt to make ROV for further refills

## 2016-07-21 NOTE — Telephone Encounter (Signed)
Routing to dr john, please advise, thanks 

## 2018-03-06 ENCOUNTER — Ambulatory Visit (INDEPENDENT_AMBULATORY_CARE_PROVIDER_SITE_OTHER): Payer: BLUE CROSS/BLUE SHIELD | Admitting: *Deleted

## 2018-03-06 DIAGNOSIS — Z23 Encounter for immunization: Secondary | ICD-10-CM

## 2019-12-30 ENCOUNTER — Telehealth: Payer: Self-pay

## 2019-12-30 NOTE — Telephone Encounter (Signed)
Sent to Dr. John. 

## 2019-12-30 NOTE — Telephone Encounter (Signed)
New message   The patient wanted to make an appt to see Dr. Raphael Gibney last office visit was 2.8.2017.   Nurse visit for flu shot 10.8.2019.   Please advise on accepting her as a new patient.

## 2019-12-30 NOTE — Telephone Encounter (Signed)
Ok with me 

## 2020-01-02 ENCOUNTER — Ambulatory Visit: Payer: 59 | Admitting: Internal Medicine

## 2020-01-02 ENCOUNTER — Ambulatory Visit (INDEPENDENT_AMBULATORY_CARE_PROVIDER_SITE_OTHER): Payer: 59

## 2020-01-02 ENCOUNTER — Other Ambulatory Visit: Payer: Self-pay

## 2020-01-02 ENCOUNTER — Encounter: Payer: Self-pay | Admitting: Internal Medicine

## 2020-01-02 VITALS — BP 120/82 | HR 93 | Temp 98.4°F | Ht 67.25 in | Wt 233.0 lb

## 2020-01-02 DIAGNOSIS — J309 Allergic rhinitis, unspecified: Secondary | ICD-10-CM

## 2020-01-02 DIAGNOSIS — M546 Pain in thoracic spine: Secondary | ICD-10-CM

## 2020-01-02 DIAGNOSIS — Z0000 Encounter for general adult medical examination without abnormal findings: Secondary | ICD-10-CM

## 2020-01-02 DIAGNOSIS — Z114 Encounter for screening for human immunodeficiency virus [HIV]: Secondary | ICD-10-CM | POA: Diagnosis not present

## 2020-01-02 DIAGNOSIS — F329 Major depressive disorder, single episode, unspecified: Secondary | ICD-10-CM | POA: Diagnosis not present

## 2020-01-02 DIAGNOSIS — Z1159 Encounter for screening for other viral diseases: Secondary | ICD-10-CM | POA: Diagnosis not present

## 2020-01-02 DIAGNOSIS — F419 Anxiety disorder, unspecified: Secondary | ICD-10-CM

## 2020-01-02 DIAGNOSIS — Z0001 Encounter for general adult medical examination with abnormal findings: Secondary | ICD-10-CM

## 2020-01-02 MED ORDER — CITALOPRAM HYDROBROMIDE 20 MG PO TABS: 20.0000 mg | ORAL_TABLET | Freq: Every day | ORAL | 3 refills | Status: AC

## 2020-01-02 MED ORDER — FLUTICASONE PROPIONATE 50 MCG/ACT NA SUSP
2.0000 | Freq: Every day | NASAL | 3 refills | Status: AC
Start: 2020-01-02 — End: 2021-01-01

## 2020-01-02 MED ORDER — ALPRAZOLAM 0.5 MG PO TABS: 0.5000 mg | ORAL_TABLET | Freq: Two times a day (BID) | ORAL | 2 refills | Status: AC | PRN

## 2020-01-02 MED ORDER — CETIRIZINE HCL 10 MG PO TABS: 10.0000 mg | ORAL_TABLET | Freq: Every day | ORAL | 11 refills | Status: AC

## 2020-01-02 NOTE — Assessment & Plan Note (Signed)
Mild to mod, for zyrtec, flonase asd,  to f/u any worsening symptoms or concerns 

## 2020-01-02 NOTE — Assessment & Plan Note (Addendum)
For xanax prn, celexa 20 qd, declines counseling referral
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
## 2020-01-02 NOTE — Assessment & Plan Note (Signed)

## 2020-01-02 NOTE — Patient Instructions (Signed)
Please take all new medication as prescribed - the xanax, celexa, zyrtec and flonase  Please continue all other medications as before  Please have the pharmacy call with any other refills you may need.  Please continue your efforts at being more active, low cholesterol diet, and weight control.  You are otherwise up to date with prevention measures today.  Please keep your appointments with your specialists as you may have planned  You will be contacted regarding the referral for: Sports Medicine  Please go to the XRAY Department in the first floor for the x-ray testing  Please go to the LAB at the blood drawing area for the tests to be done  You will be contacted by phone if any changes need to be made immediately.  Otherwise, you will receive a letter about your results with an explanation, but please check with MyChart first.  Please remember to sign up for MyChart if you have not done so, as this will be important to you in the future with finding out test results, communicating by private email, and scheduling acute appointments online when needed.  Please make an Appointment to return for your 1 year visit, or sooner if needed

## 2020-01-02 NOTE — Assessment & Plan Note (Addendum)
For films, refer sport medicine, etiology unclear  I spent 44 minutes in addition to time for CPX wellness examination in preparing to see the patient by review of recent labs, imaging and procedures, obtaining and reviewing separately obtained history, communicating with the patient and family or caregiver, ordering medications, tests or procedures, and documenting clinical information in the EHR including the differential Dx, treatment, and any further evaluation and other management of back pain, anxiety, depression, allergies

## 2020-01-03 ENCOUNTER — Encounter: Payer: Self-pay | Admitting: Internal Medicine

## 2020-01-03 LAB — CBC WITH DIFFERENTIAL/PLATELET
Absolute Monocytes: 599 cells/uL (ref 200–950)
Basophils Absolute: 16 cells/uL (ref 0–200)
Basophils Relative: 0.2 %
Eosinophils Absolute: 89 cells/uL (ref 15–500)
Eosinophils Relative: 1.1 %
HCT: 37.9 % (ref 35.0–45.0)
Hemoglobin: 12.4 g/dL (ref 11.7–15.5)
Lymphs Abs: 1580 cells/uL (ref 850–3900)
MCH: 28.1 pg (ref 27.0–33.0)
MCHC: 32.7 g/dL (ref 32.0–36.0)
MCV: 85.7 fL (ref 80.0–100.0)
MPV: 10.9 fL (ref 7.5–12.5)
Monocytes Relative: 7.4 %
Neutro Abs: 5816 cells/uL (ref 1500–7800)
Neutrophils Relative %: 71.8 %
Platelets: 247 10*3/uL (ref 140–400)
RBC: 4.42 10*6/uL (ref 3.80–5.10)
RDW: 13.1 % (ref 11.0–15.0)
Total Lymphocyte: 19.5 %
WBC: 8.1 10*3/uL (ref 3.8–10.8)

## 2020-01-03 LAB — TSH: TSH: 1.43 mIU/L

## 2020-01-03 LAB — URINALYSIS, ROUTINE W REFLEX MICROSCOPIC
Bacteria, UA: NONE SEEN /HPF
Bilirubin Urine: NEGATIVE
Glucose, UA: NEGATIVE
Hgb urine dipstick: NEGATIVE
Hyaline Cast: NONE SEEN /LPF
Ketones, ur: NEGATIVE
Nitrite: NEGATIVE
Protein, ur: NEGATIVE
Specific Gravity, Urine: 1.008 (ref 1.001–1.03)
pH: 5.5 (ref 5.0–8.0)

## 2020-01-03 LAB — LIPID PANEL
Cholesterol: 160 mg/dL (ref <200)
HDL: 45 mg/dL — ABNORMAL LOW (ref >=50)
LDL Cholesterol (Calc): 96 mg/dL (calc)
Non-HDL Cholesterol (Calc): 115 mg/dL (calc) (ref <130)
Total CHOL/HDL Ratio: 3.6 (calc) (ref <5.0)
Triglycerides: 97 mg/dL (ref <150)

## 2020-01-03 LAB — HEPATIC FUNCTION PANEL
AG Ratio: 1.6 (calc) (ref 1.0–2.5)
ALT: 15 U/L (ref 6–29)
AST: 15 U/L (ref 10–30)
Albumin: 4.7 g/dL (ref 3.6–5.1)
Alkaline phosphatase (APISO): 69 U/L (ref 31–125)
Bilirubin, Direct: 0.2 mg/dL (ref 0.0–0.2)
Globulin: 3 g/dL (calc) (ref 1.9–3.7)
Indirect Bilirubin: 0.5 mg/dL (calc) (ref 0.2–1.2)
Total Bilirubin: 0.7 mg/dL (ref 0.2–1.2)
Total Protein: 7.7 g/dL (ref 6.1–8.1)

## 2020-01-03 LAB — HEPATITIS C ANTIBODY
Hepatitis C Ab: NONREACTIVE
SIGNAL TO CUT-OFF: 0.01 (ref <1.00)

## 2020-01-03 LAB — HIV ANTIBODY (ROUTINE TESTING W REFLEX): HIV 1&2 Ab, 4th Generation: NONREACTIVE

## 2020-01-09 ENCOUNTER — Encounter: Payer: Self-pay | Admitting: Family Medicine

## 2020-01-09 ENCOUNTER — Ambulatory Visit (INDEPENDENT_AMBULATORY_CARE_PROVIDER_SITE_OTHER): Payer: 59 | Admitting: Family Medicine

## 2020-01-09 ENCOUNTER — Other Ambulatory Visit: Payer: Self-pay

## 2020-01-09 VITALS — BP 124/80 | HR 101 | Ht 67.25 in | Wt 229.4 lb

## 2020-01-09 DIAGNOSIS — M546 Pain in thoracic spine: Secondary | ICD-10-CM | POA: Diagnosis not present

## 2020-01-09 NOTE — Patient Instructions (Addendum)
Thank you for coming in today.  Plan for Physical Therapy.  Use heat and TENS unit.   Recheck in about 6 weeks of PT. Return or contact me sooner if needed.   [employer]: [employer] Translation Verse by Alveria Apley and [facility]

## 2020-01-09 NOTE — Progress Notes (Signed)
   Subjective:    I'm seeing this patient as a consultation for:  Dr. Jonny Ruiz. Note will be routed back to referring provider/PCP.  CC: T-spine  I, Christoper Fabian, LAT, ATC, am serving as scribe for Dr. Clementeen Graham.  HPI: Pt is a 34 y/o female presenting w/ c/o midline T-spine pain since 2015 when she felt a pop in her R mid back just medial to her R medial scapula when she was in the shower.  She states that ever since then, she's been having issues w/ her back from mid-back to low back.  She will intermittently have numbness/tingling in her B UEs.  Radiating pain: yes in her entire back/spine Aggravating factors: sitting; standing; walking; folding clothes; cooking; cleaning Treatments tried: Naproxen and muscle relaxer in the past; IcyHot in the past; heat  Diagnostic testing: T-spine XR- 01/02/20  Past medical history, Surgical history, Family history, Social history, Allergies, and medications have been entered into the medical record, reviewed.   Review of Systems: No fevers or chills weight loss night sweats  Objective:    Vitals:   01/09/20 1003  BP: 124/80  Pulse: (!) 101  SpO2: 98%   General: Well Developed, well nourished, and in no acute distress.   MSK: C-spine: Normal-appearing nontender normal cervical motion.  Upper extremity strength reflexes and sensation are equal normal throughout bilateral upper extremities.  T-spine normal-appearing Nontender midline.  Not particular tender paraspinal musculature or periscapular muscles. Normal thoracic motion.  Normal shoulder and scapular motion. Normal shoulder and scapular strength.  L-spine normal-appearing nontender normal L-spine motion. Lower extremity strength reflexes and sensation are intact and equal bilaterally.  Lab and Radiology Results DG Thoracic Spine 2 View  Result Date: 01/02/2020 CLINICAL DATA:  Thoracic pain EXAM: THORACIC SPINE 2 VIEWS COMPARISON:  None. FINDINGS: Minimal scoliosis. Sagittal alignment  is normal. Vertebral body heights and disc spaces are maintained. IMPRESSION: Minimal scoliosis. No acute osseous abnormality. Electronically Signed   By: Jasmine Pang M.D.   On: 01/02/2020 23:08   I, Clementeen Graham, personally (independently) visualized and performed the interpretation of the images attached in this note.   Impression and Recommendations:    Assessment and Plan: 34 y.o. female with thoracic spine pain and pain around periscapular areas.  Likely muscle dysfunction and weakness.  T-spine x-ray shows a little bit of scoliosis but otherwise is pretty normal.  I do not think the main issue is her scoliosis or anything really to do with the bony aspect of her thoracic spine.  At this point next best treatment is trial of physical therapy along with heating pad and TENS unit.  Recheck in about 6 weeks or so.  If not improved would consider advanced imaging of thoracic spine or imaging of C-spine.  Recheck or contact me sooner if needed..   Orders Placed This Encounter  Procedures  . Ambulatory referral to Physical Therapy    Referral Priority:   Routine    Referral Type:   Physical Medicine    Referral Reason:   Specialty Services Required    Requested Specialty:   Physical Therapy     Discussed warning signs or symptoms. Please see discharge instructions. Patient expresses understanding.   The above documentation has been reviewed and is accurate and complete Clementeen Graham, M.D.

## 2020-01-16 ENCOUNTER — Other Ambulatory Visit: Payer: Self-pay

## 2020-01-16 ENCOUNTER — Encounter: Payer: Self-pay | Admitting: Rehabilitative and Restorative Service Providers"

## 2020-01-16 ENCOUNTER — Ambulatory Visit (INDEPENDENT_AMBULATORY_CARE_PROVIDER_SITE_OTHER): Payer: 59 | Admitting: Rehabilitative and Restorative Service Providers"

## 2020-01-16 DIAGNOSIS — M546 Pain in thoracic spine: Secondary | ICD-10-CM | POA: Diagnosis not present

## 2020-01-16 DIAGNOSIS — R293 Abnormal posture: Secondary | ICD-10-CM | POA: Diagnosis not present

## 2020-01-16 DIAGNOSIS — M545 Low back pain: Secondary | ICD-10-CM | POA: Diagnosis not present

## 2020-01-16 DIAGNOSIS — G8929 Other chronic pain: Secondary | ICD-10-CM

## 2020-01-16 DIAGNOSIS — R262 Difficulty in walking, not elsewhere classified: Secondary | ICD-10-CM

## 2020-01-16 DIAGNOSIS — M6281 Muscle weakness (generalized): Secondary | ICD-10-CM | POA: Diagnosis not present

## 2020-01-16 NOTE — Patient Instructions (Signed)
Access Code: [email or web address] URL: [email or web address] Date: 01/16/2020 Prepared by: Chyrel Masson  Exercises Supine Lower Trunk Rotation - 2 x daily - 7 x weekly - 5 reps - 1 sets - 15 hold Sidelying Lumbar Rotation Stretch - 2 x daily - 7 x weekly - 1 sets - 5 reps - 15 hold Seated Thoracic Lumbar Extension - 2 x daily - 7 x weekly - 3 sets - 10 reps

## 2020-01-16 NOTE — Therapy (Signed)
[facility] Physical Therapy [address] [place], [place], [address] Phone: [phone]   Fax:  [phone]  Physical Therapy Evaluation  Patient Details  Name: Cynthia Pham MRN: [id number] Date of Birth: 22-Mar-1986 Referring Provider (PT): Dr. Clementeen Graham   Encounter Date: 01/16/2020   PT End of Session - 01/16/20 1458    Visit Number 1    Number of Visits 12    Date for PT Re-Evaluation 03/12/20    Progress Note Due on Visit 10    PT Start Time 1505    PT Stop Time 1550    PT Time Calculation (min) 45 min    Activity Tolerance Patient tolerated treatment well    Behavior During Therapy [facility] for tasks assessed/performed           Past Medical History:  Diagnosis Date  . ALLERGIC RHINITIS 06/18/2008  . ANXIETY 06/18/2008   bipolar  . Bipolar 1 disorder (HCC) 07/08/2015  . RASH-NONVESICULAR 06/18/2008  . SINUSITIS- ACUTE-NOS 07/09/2009    History reviewed. No pertinent surgical history.  There were no vitals filed for this visit.    Subjective Assessment - 01/16/20 1454    Subjective Pt. indicated complaints in back which she first noted in high school and more recently since 2015 range.  Pt. stated she previously played softball and in grad school in 2014/2015 her activity level decreased.  Pop in 2015 while in shower produced pain in back.  Pt. stated off and on pain in thoracic spine region.  Pt. stated difficulty c doing exercise due to symptoms and notices pain during daily activity and work.    Pertinent History MD note: Pt is a 34 y/o female presenting w/ c/o midline T-spine pain since 2015 when she felt a pop in her R mid back just medial to her R medial scapula when she was in the shower.  She states that ever since then, she's been having issues w/ her back from mid-back to low back.  She will intermittently have numbness/tingling in her B UEs.    Limitations Sitting;Lifting;Walking;Standing    Diagnostic tests xray    Patient Stated Goals  Reduce pain, increase exercise routine    Currently in Pain? Yes    Pain Score 7    at worst 10/10   Pain Location Thoracic   lumbar as well   Pain Descriptors / Indicators Constant;Tightness;Sore;Aching    Pain Type Chronic pain    Pain Onset More than a month ago    Aggravating Factors  sitting; standing; walking; folding clothes; cooking; cleaning    Pain Relieving Factors sleeping on couch at times, avoiding activity, medicine at times    Effect of Pain on Daily Activities Walking, work, sleeping.              [facility] PT Assessment - 01/16/20 0001      Assessment   Medical Diagnosis Thoracic spine pain    Referring Provider (PT) Dr. Clementeen Graham    Onset Date/Surgical Date 06/17/13    Hand Dominance Right      Precautions   Precautions None      Restrictions   Weight Bearing Restrictions No      Balance Screen   Has the patient fallen in the past 6 months No    Is the patient reluctant to leave their home because of a fear of falling?  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  Lumbar unremarkable in standing      ROM / Strength   AROM / PROM / Strength AROM;PROM;Strength      AROM   Overall AROM Comments Cervical WFL s symptoms.      AROM Assessment Site Cervical;Thoracic;Lumbar    Lumbar Flexion to toes without symptoms    Lumbar Extension 75% c ERP Rt lumbar, REIS x 5 produced 100 % WFL c similar symptoms    Lumbar - Right Side Bend to knee jt    Lumbar - Left Side Bend to knee jt    Thoracic Flexion to  lap no complaints    Thoracic Extension 75% c tightness    Thoracic - Right Rotation 100 %     Thoracic - Left Rotation 75% c tightness      Strength   Overall Strength Comments Lumbar flexion MMT 3+/5    Strength Assessment Site Shoulder    Right/Left Shoulder Left;Right      Flexibility   Soft Tissue Assessment /Muscle Length yes    Hamstrings passive SLR rt 80 deg c lumbar pain, passive SLR Lt 90 deg      Palpation   Spinal mobility Normal mobility T12-L5, mild hypomobility Mid thoracic T6-T9    Palpation comment TrP noted in Rt QL > Lt QL, mid thoracic paraspinals TrP                      Objective measurements completed on examination: See above findings.       [facility] Adult PT Treatment/Exercise - 01/16/20 0001      Exercises   Exercises Other Exercises;Lumbar    Other Exercises  HEP instruction/performance c cues on techniques as well as education on dry needling after care.        Lumbar Exercises: Stretches   Lower Trunk Rotation Other (comment)   15 sec x 5 bilateral   Other Lumbar Stretch Exercise sidelying regional rotation stretch 15 sec x 5 bilateral      Lumbar Exercises: Seated   Other Seated Lumbar Exercises thoracic extension in chair 3 x 10      Manual Therapy   Manual therapy comments Palpation, compression to TrP rt QL, g3 cPA mid thoracic            Trigger Point Dry Needling - 01/16/20 0001    Consent Given? Yes    Education Handout Provided Yes    Muscles Treated Back/Hip Quadratus lumborum   rt   Quadratus Lumborum Response Twitch response elicited                PT Education - 01/16/20 1458    Education Details HEP, POC, DN    Person(s) Educated Patient    Methods Explanation;Handout;Verbal cues    Comprehension Verbalized understanding;Returned demonstration            PT Short Term Goals - 01/16/20 1555      PT SHORT TERM GOAL #1   Title Patient will demonstrate independent use of home exercise program to  maintain progress from in clinic treatments.    Time 2    Period Weeks    Status New    Target Date 01/30/20             PT Long Term Goals - 01/16/20 1457      PT LONG TERM GOAL #1   Title Patient will demonstrate/report pain at worst less than or equal to 2/10 to facilitate minimal limitation in daily activity secondary to pain symptoms.    Time 8    Period Weeks    Status New    Target Date 03/12/20      PT LONG TERM GOAL #2   Title Patient will demonstrate independent use of home exercise program to facilitate ability to maintain/progress functional gains from skilled physical therapy services.    Time 8    Period Weeks    Status New    Target Date 03/12/20      PT LONG TERM GOAL #3   Title Pt. will demonstrate thoracic AROM WFL s symptoms to facilitate usual daily and recreational mobility at PLOF s limitation.    Time 8    Period Weeks    Status New    Target Date 03/12/20      PT LONG TERM GOAL #4   Title Pt. will demonstrate lumbar AROM s symptoms for daily mobility at PLOF.    Time 8    Period Weeks    Status New    Target Date 03/12/20      PT LONG TERM GOAL #5   Title Pt. will demonstrate lumbar flexion MMT > or = 4+/5 to facilitate improved stability c daily and recreational activity.    Time 8    Period Weeks    Status New    Target Date 03/12/20      Additional Long Term Goals   Additional Long Term Goals Yes      PT LONG TERM GOAL #6   Title Pt. will report ability to perform walking, gym for exercise.    Time 8    Period Weeks    Status New    Target Date 03/12/20                  Plan - 01/16/20 1455    Clinical Impression Statement Patient is a 34 y.o. female who comes to clinic with complaints of thoracic pain with mobility deficits, lumbar pain c movement coordination deficits c myofascial trigger points that impair their ability to perform usual daily and recreational functional activities without increase difficulty/symptoms  at this time.  Patient to benefit from skilled PT services to address impairments and limitations to improve to previous level of function without restriction secondary to condition.    Examination-Participation Restrictions Meal Prep;Laundry;Cleaning    Stability/Clinical Decision Making Stable/Uncomplicated    Clinical Decision Making Low    Rehab Potential Good    PT Frequency --   1-2x/week   PT Duration 8 weeks    PT Treatment/Interventions ADLs/Self Care Home Management;Cryotherapy;Electrical Stimulation;Iontophoresis 4mg /ml Dexamethasone;Moist Heat;Traction;Balance training;Therapeutic exercise;Therapeutic activities;Functional mobility training;Stair training;Gait training;Ultrasound;Neuromuscular re-education;Patient/family education;Manual techniques;Taping;Dry needling;Passive range of motion;Spinal Manipulations;Joint Manipulations    PT Next Visit Plan DN prn, thoracic and lumbar mobility, lumbar/hip strengthening.    PT Home Exercise Plan [phone]    Consulted and Agree with Plan of Care Patient           Patient will benefit from skilled therapeutic intervention in order to improve the following deficits and impairments:  Hypomobility, Decreased endurance, Pain, Increased fascial restricitons, Decreased strength, Decreased activity tolerance, Decreased mobility, Difficulty walking, Increased muscle spasms, Postural dysfunction, Impaired flexibility, Decreased coordination, Decreased range of motion  Visit Diagnosis: Pain in thoracic spine  Abnormal posture  Chronic bilateral low back pain without sciatica  Muscle weakness (generalized)  Difficulty in walking, not elsewhere classified     Problem List Patient Active Problem List   Diagnosis Date Noted  . Midline thoracic back pain 01/02/2020  . Fatigue 07/08/2015  . Bipolar 1 disorder (HCC) 07/08/2015  . Mild obesity 07/08/2015  . Mid back pain on right side 07/08/2015  . Encounter for well adult exam with  abnormal findings 10/15/2010  . Anxiety and depression 06/18/2008  . Allergic rhinitis 06/18/2008   Chyrel Masson, PT, DPT, OCS, ATC 01/16/20  3:56 PM    [facility] Physical Therapy [address] [place], [place], [address] Phone: [phone]   Fax:  [phone]  Name: BEILA PURDIE MRN: [id number] Date of Birth: 1985-10-18

## 2020-01-30 ENCOUNTER — Ambulatory Visit (INDEPENDENT_AMBULATORY_CARE_PROVIDER_SITE_OTHER): Payer: 59 | Admitting: Rehabilitative and Restorative Service Providers"

## 2020-01-30 ENCOUNTER — Encounter: Payer: Self-pay | Admitting: Rehabilitative and Restorative Service Providers"

## 2020-01-30 ENCOUNTER — Other Ambulatory Visit: Payer: Self-pay

## 2020-01-30 DIAGNOSIS — M546 Pain in thoracic spine: Secondary | ICD-10-CM

## 2020-01-30 DIAGNOSIS — M6281 Muscle weakness (generalized): Secondary | ICD-10-CM | POA: Diagnosis not present

## 2020-01-30 DIAGNOSIS — G8929 Other chronic pain: Secondary | ICD-10-CM

## 2020-01-30 DIAGNOSIS — R293 Abnormal posture: Secondary | ICD-10-CM

## 2020-01-30 DIAGNOSIS — M545 Low back pain, unspecified: Secondary | ICD-10-CM

## 2020-01-30 DIAGNOSIS — R262 Difficulty in walking, not elsewhere classified: Secondary | ICD-10-CM

## 2020-01-30 NOTE — Therapy (Signed)
[facility] Physical Therapy [address] [place], [place], [address] Phone: [phone]   Fax:  [phone]  Physical Therapy Treatment  Patient Details  Name: Cynthia Pham MRN: [id number] Date of Birth: 1986-02-16 Referring Provider (PT): Dr. Clementeen Graham   Encounter Date: 01/30/2020   PT End of Session - 01/30/20 1457    Visit Number 2    Number of Visits 12    Date for PT Re-Evaluation 03/12/20    Progress Note Due on Visit 10    PT Start Time 1505    PT Stop Time 1545    PT Time Calculation (min) 40 min    Activity Tolerance Patient tolerated treatment well    Behavior During Therapy [facility] for tasks assessed/performed           Past Medical History:  Diagnosis Date  . ALLERGIC RHINITIS 06/18/2008  . ANXIETY 06/18/2008   bipolar  . Bipolar 1 disorder (HCC) 07/08/2015  . RASH-NONVESICULAR 06/18/2008  . SINUSITIS- ACUTE-NOS 07/09/2009    History reviewed. No pertinent surgical history.  There were no vitals filed for this visit.   Subjective Assessment - 01/30/20 1507    Subjective Pt. indicated feeling improvement from last visit.  Pt. stated Lt side more noted today and recently since Rt side improved.  Pt. stated doing some of the HEP but not 2x/day.  Rt thoracic pain still noted.    Pertinent History MD note: Pt is a 34 y/o female presenting w/ c/o midline T-spine pain since 2015 when she felt a pop in her R mid back just medial to her R medial scapula when she was in the shower.  She states that ever since then, she's been having issues w/ her back from mid-back to low back.  She will intermittently have numbness/tingling in her B UEs.    Limitations Sitting;Lifting;Walking;Standing    Diagnostic tests xray    Patient Stated Goals Reduce pain, increase exercise routine    Currently in Pain? Yes    Pain Score 7     Pain Location Thoracic    Pain Orientation Right    Pain Radiating Towards Lt lumbar    Pain Onset More than a month ago     Multiple Pain Sites No                             [facility] Adult PT Treatment/Exercise - 01/30/20 0001      Exercises   Exercises Shoulder      Lumbar Exercises: Stretches   Other Lumbar Stretch Exercise sidelying regional rotation stretch 15 sec x 5 bilateral      Lumbar Exercises: Aerobic   Nustep Lvl 6 10 mins      Lumbar Exercises: Supine   Dead Bug 15 reps   bilateral     Shoulder Exercises: Standing   External Rotation Right;20 reps    Theraband Level (Shoulder External Rotation) Level 3 (Green)      Shoulder Exercises: Stretch   Other Shoulder Stretches cross arm stretch 30 sec x 3 bilateral      Manual Therapy   Manual therapy comments compression/palpation to Lt QL, Rt infraspinatus            Trigger Point Dry Needling - 01/30/20 0001    Consent Given? Yes    Education Handout Provided No    Muscles Treated Upper Quadrant Infraspinatus   Rt   Muscles Treated Back/Hip Quadratus lumborum  Lt   Quadratus Lumborum Response Twitch response elicited                  PT Short Term Goals - 01/16/20 1555      PT SHORT TERM GOAL #1   Title Patient will demonstrate independent use of home exercise program to maintain progress from in clinic treatments.    Time 2    Period Weeks    Status New    Target Date 01/30/20             PT Long Term Goals - 01/16/20 1457      PT LONG TERM GOAL #1   Title Patient will demonstrate/report pain at worst less than or equal to 2/10 to facilitate minimal limitation in daily activity secondary to pain symptoms.    Time 8    Period Weeks    Status New    Target Date 03/12/20      PT LONG TERM GOAL #2   Title Patient will demonstrate independent use of home exercise program to facilitate ability to maintain/progress functional gains from skilled physical therapy services.    Time 8    Period Weeks    Status New    Target Date 03/12/20      PT LONG TERM GOAL #3   Title Pt. will demonstrate  thoracic AROM WFL s symptoms to facilitate usual daily and recreational mobility at PLOF s limitation.    Time 8    Period Weeks    Status New    Target Date 03/12/20      PT LONG TERM GOAL #4   Title Pt. will demonstrate lumbar AROM s symptoms for daily mobility at PLOF.    Time 8    Period Weeks    Status New    Target Date 03/12/20      PT LONG TERM GOAL #5   Title Pt. will demonstrate lumbar flexion MMT > or = 4+/5 to facilitate improved stability c daily and recreational activity.    Time 8    Period Weeks    Status New    Target Date 03/12/20      Additional Long Term Goals   Additional Long Term Goals Yes      PT LONG TERM GOAL #6   Title Pt. will report ability to perform walking, gym for exercise.    Time 8    Period Weeks    Status New    Target Date 03/12/20                 Plan - 01/30/20 1523    Clinical Impression Statement Positive benefits noted from skilled PT services last visit for Rt lower lumbar.  Repeated similar intervention in manual for Lt QL and Rt infraspinatus concordant symptoms today c good tolerance.  Recommend continued skilled PT services at this time.    Examination-Participation Restrictions Meal Prep;Laundry;Cleaning    Stability/Clinical Decision Making Stable/Uncomplicated    Rehab Potential Good    PT Frequency --   1-2x/week   PT Duration 8 weeks    PT Treatment/Interventions ADLs/Self Care Home Management;Cryotherapy;Electrical Stimulation;Iontophoresis 4mg /ml Dexamethasone;Moist Heat;Traction;Balance training;Therapeutic exercise;Therapeutic activities;Functional mobility training;Stair training;Gait training;Ultrasound;Neuromuscular re-education;Patient/family education;Manual techniques;Taping;Dry needling;Passive range of motion;Spinal Manipulations;Joint Manipulations    PT Next Visit Plan DN prn, thoracic and lumbar mobility, lumbar/hip strengthening.    PT Home Exercise Plan [email or web address]    Consulted and Agree with Plan of  Care Patient  Patient will benefit from skilled therapeutic intervention in order to improve the following deficits and impairments:  Hypomobility, Decreased endurance, Pain, Increased fascial restricitons, Decreased strength, Decreased activity tolerance, Decreased mobility, Difficulty walking, Increased muscle spasms, Postural dysfunction, Impaired flexibility, Decreased coordination, Decreased range of motion  Visit Diagnosis: Pain in thoracic spine  Abnormal posture  Chronic bilateral low back pain without sciatica  Muscle weakness (generalized)  Difficulty in walking, not elsewhere classified     Problem List Patient Active Problem List   Diagnosis Date Noted  . Midline thoracic back pain 01/02/2020  . Fatigue 07/08/2015  . Bipolar 1 disorder (HCC) 07/08/2015  . Mild obesity 07/08/2015  . Mid back pain on right side 07/08/2015  . Encounter for well adult exam with abnormal findings 10/15/2010  . Anxiety and depression 06/18/2008  . Allergic rhinitis 06/18/2008    Chyrel Masson, PT, DPT, OCS, ATC 01/30/20  3:39 PM    [facility] Physical Therapy [address] [place], [place], [address] Phone: [phone]   Fax:  [phone]  Name: Cynthia Pham MRN: [id number] Date of Birth: [birth year]

## 2020-02-05 ENCOUNTER — Encounter: Payer: Self-pay | Admitting: Rehabilitative and Restorative Service Providers"

## 2020-02-05 ENCOUNTER — Ambulatory Visit (INDEPENDENT_AMBULATORY_CARE_PROVIDER_SITE_OTHER): Payer: 59 | Admitting: Rehabilitative and Restorative Service Providers"

## 2020-02-05 ENCOUNTER — Other Ambulatory Visit: Payer: Self-pay

## 2020-02-05 DIAGNOSIS — M546 Pain in thoracic spine: Secondary | ICD-10-CM

## 2020-02-05 DIAGNOSIS — M545 Low back pain, unspecified: Secondary | ICD-10-CM

## 2020-02-05 DIAGNOSIS — G8929 Other chronic pain: Secondary | ICD-10-CM

## 2020-02-05 DIAGNOSIS — R293 Abnormal posture: Secondary | ICD-10-CM | POA: Diagnosis not present

## 2020-02-05 DIAGNOSIS — M6281 Muscle weakness (generalized): Secondary | ICD-10-CM

## 2020-02-05 DIAGNOSIS — R262 Difficulty in walking, not elsewhere classified: Secondary | ICD-10-CM

## 2020-02-05 NOTE — Therapy (Signed)
[facility] Physical Therapy [address] [place], [place], [address] Phone: [phone]   Fax:  [phone]  Physical Therapy Treatment  Patient Details  Name: Cynthia Pham MRN: [id number] Date of Birth: [birth year] Referring Provider (PT): Dr. Clementeen Graham   Encounter Date: 02/05/2020   PT End of Session - 02/05/20 1429    Visit Number 3    Number of Visits 12    Date for PT Re-Evaluation 03/12/20    Progress Note Due on Visit 10    PT Start Time 1430    PT Stop Time 1510    PT Time Calculation (min) 40 min    Activity Tolerance Patient tolerated treatment well    Behavior During Therapy [facility] for tasks assessed/performed           Past Medical History:  Diagnosis Date  . ALLERGIC RHINITIS 06/18/2008  . ANXIETY 06/18/2008   bipolar  . Bipolar 1 disorder (HCC) 07/08/2015  . RASH-NONVESICULAR 06/18/2008  . SINUSITIS- ACUTE-NOS 07/09/2009    History reviewed. No pertinent surgical history.  There were no vitals filed for this visit.   Subjective Assessment - 02/05/20 1431    Subjective Pt. indicated some return on Rt side lower pain since last visit (didn't needle that area).  Pt. stated having some improvement in Lt lumbar and Rt upper thoracic/shoulder since last visit (treatment focus).  Pt. stated feeling like her back feels tired and painful with increased cleaning.    Pertinent History MD note: Pt is a 34 y/o female presenting w/ c/o midline T-spine pain since 2015 when she felt a pop in her R mid back just medial to her R medial scapula when she was in the shower.  She states that ever since then, she's been having issues w/ her back from mid-back to low back.  She will intermittently have numbness/tingling in her B UEs.    Limitations Sitting;Lifting;Walking;Standing    Diagnostic tests xray    Patient Stated Goals Reduce pain, increase exercise routine    Currently in Pain? Yes    Pain Score 6     Pain Location Back    Pain Orientation  Lower;Proximal    Pain Descriptors / Indicators Sore    Pain Type Chronic pain    Pain Onset More than a month ago    Aggravating Factors  increased cleaning activity, sleeping    Pain Relieving Factors some needling/exercise                             [facility] Adult PT Treatment/Exercise - 02/05/20 0001      Lumbar Exercises: Stretches   Lower Trunk Rotation 5 reps   15 sec x 5 bilateral     Lumbar Exercises: Aerobic   Nustep Lvl 6 10 mins      Lumbar Exercises: Supine   Bridge Compliant;20 reps    Other Supine Lumbar Exercises pball press supine 10 sec x 10    Other Supine Lumbar Exercises isometric UE/LE ipsilateral press 10 sec x 5 bilateral      Lumbar Exercises: Prone   Other Prone Lumbar Exercises plank on knees to fatigue x 3      Lumbar Exercises: Quadruped   Opposite Arm/Leg Raise Left arm/Right leg;Right arm/Left leg;15 reps                    PT Short Term Goals - 01/16/20 1555      PT  SHORT TERM GOAL #1   Title Patient will demonstrate independent use of home exercise program to maintain progress from in clinic treatments.    Time 2    Period Weeks    Status New    Target Date 01/30/20             PT Long Term Goals - 01/16/20 1457      PT LONG TERM GOAL #1   Title Patient will demonstrate/report pain at worst less than or equal to 2/10 to facilitate minimal limitation in daily activity secondary to pain symptoms.    Time 8    Period Weeks    Status New    Target Date 03/12/20      PT LONG TERM GOAL #2   Title Patient will demonstrate independent use of home exercise program to facilitate ability to maintain/progress functional gains from skilled physical therapy services.    Time 8    Period Weeks    Status New    Target Date 03/12/20      PT LONG TERM GOAL #3   Title Pt. will demonstrate thoracic AROM WFL s symptoms to facilitate usual daily and recreational mobility at PLOF s limitation.    Time 8    Period Weeks     Status New    Target Date 03/12/20      PT LONG TERM GOAL #4   Title Pt. will demonstrate lumbar AROM s symptoms for daily mobility at PLOF.    Time 8    Period Weeks    Status New    Target Date 03/12/20      PT LONG TERM GOAL #5   Title Pt. will demonstrate lumbar flexion MMT > or = 4+/5 to facilitate improved stability c daily and recreational activity.    Time 8    Period Weeks    Status New    Target Date 03/12/20      Additional Long Term Goals   Additional Long Term Goals Yes      PT LONG TERM GOAL #6   Title Pt. will report ability to perform walking, gym for exercise.    Time 8    Period Weeks    Status New    Target Date 03/12/20                 Plan - 02/05/20 1442    Clinical Impression Statement Symptoms indicated more centrally in lumbar region today, located in paraspinals primarily.  Pt. may continue to benefit from skilled PT services to address myofascial referred pain complaints as well as improving core strength/control to improve tolerance to activity.    Examination-Participation Restrictions Meal Prep;Laundry;Cleaning    Stability/Clinical Decision Making Stable/Uncomplicated    Rehab Potential Good    PT Frequency --   1-2x/week   PT Duration 8 weeks    PT Treatment/Interventions ADLs/Self Care Home Management;Cryotherapy;Electrical Stimulation;Iontophoresis 4mg /ml Dexamethasone;Moist Heat;Traction;Balance training;Therapeutic exercise;Therapeutic activities;Functional mobility training;Stair training;Gait training;Ultrasound;Neuromuscular re-education;Patient/family education;Manual techniques;Taping;Dry needling;Passive range of motion;Spinal Manipulations;Joint Manipulations    PT Next Visit Plan DN prn, thoracic and lumbar mobility, lumbar/hip strengthening.    PT Home Exercise Plan [id number]    Consulted and Agree with Plan of Care Patient           Patient will benefit from skilled therapeutic intervention in order to improve the  following deficits and impairments:  Hypomobility, Decreased endurance, Pain, Increased fascial restricitons, Decreased strength, Decreased activity tolerance, Decreased mobility, Difficulty walking, Increased muscle spasms, Postural dysfunction,  Impaired flexibility, Decreased coordination, Decreased range of motion  Visit Diagnosis: Pain in thoracic spine  Abnormal posture  Chronic bilateral low back pain without sciatica  Muscle weakness (generalized)  Difficulty in walking, not elsewhere classified     Problem List Patient Active Problem List   Diagnosis Date Noted  . Midline thoracic back pain 01/02/2020  . Fatigue 07/08/2015  . Bipolar 1 disorder (HCC) 07/08/2015  . Mild obesity 07/08/2015  . Mid back pain on right side 07/08/2015  . Encounter for well adult exam with abnormal findings 10/15/2010  . Anxiety and depression 06/18/2008  . Allergic rhinitis 06/18/2008    Chyrel Masson, PT, DPT, OCS, ATC 02/05/20  2:59 PM    [facility] Physical Therapy [address] [place], [place], [address] Phone: [phone]   Fax:  [phone]  Name: Cynthia Pham MRN: [id number] Date of Birth: [birth year]

## 2020-02-11 ENCOUNTER — Ambulatory Visit (INDEPENDENT_AMBULATORY_CARE_PROVIDER_SITE_OTHER): Payer: 59 | Admitting: Rehabilitative and Restorative Service Providers"

## 2020-02-11 ENCOUNTER — Encounter: Payer: Self-pay | Admitting: Rehabilitative and Restorative Service Providers"

## 2020-02-11 ENCOUNTER — Other Ambulatory Visit: Payer: Self-pay

## 2020-02-11 DIAGNOSIS — M545 Low back pain: Secondary | ICD-10-CM

## 2020-02-11 DIAGNOSIS — R293 Abnormal posture: Secondary | ICD-10-CM | POA: Diagnosis not present

## 2020-02-11 DIAGNOSIS — R262 Difficulty in walking, not elsewhere classified: Secondary | ICD-10-CM

## 2020-02-11 DIAGNOSIS — G8929 Other chronic pain: Secondary | ICD-10-CM

## 2020-02-11 DIAGNOSIS — M6281 Muscle weakness (generalized): Secondary | ICD-10-CM

## 2020-02-11 DIAGNOSIS — M546 Pain in thoracic spine: Secondary | ICD-10-CM | POA: Diagnosis not present

## 2020-02-11 NOTE — Therapy (Signed)
[facility] Physical Therapy [address] [place], [place], [address] Phone: [phone]   Fax:  [phone]  Physical Therapy Treatment  Patient Details  Name: Cynthia Pham MRN: [id number] Date of Birth: 1986-03-16 Referring Provider (PT): Dr. Clementeen Graham   Encounter Date: 02/11/2020   PT End of Session - 02/11/20 1545    Visit Number 4    Number of Visits 12    Date for PT Re-Evaluation 03/12/20    Progress Note Due on Visit 10    PT Start Time 1545    PT Stop Time 1625    PT Time Calculation (min) 40 min    Activity Tolerance Patient tolerated treatment well    Behavior During Therapy [facility] for tasks assessed/performed           Past Medical History:  Diagnosis Date  . ALLERGIC RHINITIS 06/18/2008  . ANXIETY 06/18/2008   bipolar  . Bipolar 1 disorder (HCC) 07/08/2015  . RASH-NONVESICULAR 06/18/2008  . SINUSITIS- ACUTE-NOS 07/09/2009    History reviewed. No pertinent surgical history.  There were no vitals filed for this visit.   Subjective Assessment - 02/11/20 1546    Subjective Pt. indicated feeling fairly well over the weekend.  Pt. stated wanting to work on both shoulders today.  Pt. indicated she went to [employer] for the first time in a while and felt pain c walking prolonged on hard floors produced complaints in lower back.    Pertinent History MD note: Pt is a 34 y/o female presenting w/ c/o midline T-spine pain since 2015 when she felt a pop in her R mid back just medial to her R medial scapula when she was in the shower.  She states that ever since then, she's been having issues w/ her back from mid-back to low back.  She will intermittently have numbness/tingling in her B UEs.    Limitations Sitting;Lifting;Walking;Standing    Diagnostic tests xray    Patient Stated Goals Reduce pain, increase exercise routine    Currently in Pain? Yes    Pain Location Back   posterior shoulder/upper back   Pain Orientation Left;Right    Pain Descriptors /  Indicators Sore    Pain Type Chronic pain    Pain Onset More than a month ago    Aggravating Factors  walking on concrete floors    Pain Relieving Factors physical therapy/dn    Effect of Pain on Daily Activities prolonged walking, housework                             [facility] Adult PT Treatment/Exercise - 02/11/20 0001      Lumbar Exercises: Stretches   Lower Trunk Rotation 5 reps   15 sec x 5 bilateral     Lumbar Exercises: Aerobic   UBE (Upper Arm Bike) UBE UE/LE lvl 6 5 mins fwd, 2 mins backwards lvl 2      Lumbar Exercises: Quadruped   Opposite Arm/Leg Raise Left arm/Right leg;Right arm/Left leg;15 reps      Modalities   Modalities Geologist, engineering Location lumbar    Electrical Stimulation Action IFC    Electrical Stimulation Parameters to tolerance    Electrical Stimulation Goals Pain      Manual Therapy   Manual therapy comments compression to Lt and Rt infraspinatus            Trigger Point Dry  Needling - 02/11/20 0001    Consent Given? Yes    Education Handout Provided No    Muscles Treated Upper Quadrant Infraspinatus   Lt and Rt   Infraspinatus Response Twitch response elicited                  PT Short Term Goals - 02/11/20 1602      PT SHORT TERM GOAL #1   Title Patient will demonstrate independent use of home exercise program to maintain progress from in clinic treatments.    Time 2    Period Weeks    Status Achieved    Target Date 01/30/20             PT Long Term Goals - 02/11/20 1603      PT LONG TERM GOAL #1   Title Patient will demonstrate/report pain at worst less than or equal to 2/10 to facilitate minimal limitation in daily activity secondary to pain symptoms.    Time 8    Period Weeks    Status On-going    Target Date 03/12/20      PT LONG TERM GOAL #2   Title Patient will demonstrate independent use of home exercise program to facilitate  ability to maintain/progress functional gains from skilled physical therapy services.    Time 8    Period Weeks    Status Achieved    Target Date 03/12/20      PT LONG TERM GOAL #3   Title Pt. will demonstrate thoracic AROM WFL s symptoms to facilitate usual daily and recreational mobility at PLOF s limitation.    Time 8    Period Weeks    Status On-going    Target Date 03/12/20      PT LONG TERM GOAL #4   Title Pt. will demonstrate lumbar AROM s symptoms for daily mobility at PLOF.    Time 8    Period Weeks    Status On-going    Target Date 03/12/20      PT LONG TERM GOAL #5   Title Pt. will demonstrate lumbar flexion MMT > or = 4+/5 to facilitate improved stability c daily and recreational activity.    Time 8    Period Weeks    Status On-going    Target Date 03/12/20      PT LONG TERM GOAL #6   Title Pt. will report ability to perform walking, gym for exercise.    Time 8    Period Weeks    Status On-going    Target Date 03/12/20                 Plan - 02/11/20 1611    Clinical Impression Statement Pt. has demonstrated/reported periods of improvement c exacerbation at times c previously discussed aggravating fx.  Pt. presented c continued indication for skilled PT services c progression activity tolerance improvements through in clinic care and HEP to improve strength/mobility throughout core/trunk and UE/LE.    Examination-Participation Restrictions Meal Prep;Laundry;Cleaning    Stability/Clinical Decision Making Stable/Uncomplicated    Rehab Potential Good    PT Frequency --   1-2x/week   PT Duration 8 weeks    PT Treatment/Interventions ADLs/Self Care Home Management;Cryotherapy;Electrical Stimulation;Iontophoresis 4mg /ml Dexamethasone;Moist Heat;Traction;Balance training;Therapeutic exercise;Therapeutic activities;Functional mobility training;Stair training;Gait training;Ultrasound;Neuromuscular re-education;Patient/family education;Manual techniques;Taping;Dry  needling;Passive range of motion;Spinal Manipulations;Joint Manipulations    PT Next Visit Plan Use DN prn, core activation/strengthening, gross exercise improvements.    PT Home Exercise Plan [phone]    Consulted  and Agree with Plan of Care Patient           Patient will benefit from skilled therapeutic intervention in order to improve the following deficits and impairments:  Hypomobility, Decreased endurance, Pain, Increased fascial restricitons, Decreased strength, Decreased activity tolerance, Decreased mobility, Difficulty walking, Increased muscle spasms, Postural dysfunction, Impaired flexibility, Decreased coordination, Decreased range of motion  Visit Diagnosis: Pain in thoracic spine  Abnormal posture  Chronic bilateral low back pain without sciatica  Muscle weakness (generalized)  Difficulty in walking, not elsewhere classified     Problem List Patient Active Problem List   Diagnosis Date Noted  . Midline thoracic back pain 01/02/2020  . Fatigue 07/08/2015  . Bipolar 1 disorder (HCC) 07/08/2015  . Mild obesity 07/08/2015  . Mid back pain on right side 07/08/2015  . Encounter for well adult exam with abnormal findings 10/15/2010  . Anxiety and depression 06/18/2008  . Allergic rhinitis 06/18/2008    Chyrel Masson, PT, DPT, OCS, ATC 02/11/20  4:21 PM    [facility] [address] [place], [place], [address] Phone: [phone]   Fax:  [phone]  Name: Cynthia Pham MRN: [id number] Date of Birth: 1985-07-15

## 2020-02-19 ENCOUNTER — Other Ambulatory Visit: Payer: Self-pay

## 2020-02-19 ENCOUNTER — Ambulatory Visit (INDEPENDENT_AMBULATORY_CARE_PROVIDER_SITE_OTHER): Payer: 59 | Admitting: Rehabilitative and Restorative Service Providers"

## 2020-02-19 ENCOUNTER — Encounter: Payer: Self-pay | Admitting: Rehabilitative and Restorative Service Providers"

## 2020-02-19 DIAGNOSIS — M546 Pain in thoracic spine: Secondary | ICD-10-CM

## 2020-02-19 DIAGNOSIS — M6281 Muscle weakness (generalized): Secondary | ICD-10-CM

## 2020-02-19 DIAGNOSIS — M545 Low back pain: Secondary | ICD-10-CM | POA: Diagnosis not present

## 2020-02-19 DIAGNOSIS — R293 Abnormal posture: Secondary | ICD-10-CM | POA: Diagnosis not present

## 2020-02-19 DIAGNOSIS — R262 Difficulty in walking, not elsewhere classified: Secondary | ICD-10-CM

## 2020-02-19 DIAGNOSIS — G8929 Other chronic pain: Secondary | ICD-10-CM

## 2020-02-19 NOTE — Therapy (Signed)
[facility] Physical Therapy [address] [place], [place], [address] Phone: [phone]   Fax:  [phone]  Physical Therapy Treatment  Patient Details  Name: Cynthia Pham MRN: [id number] Date of Birth: July 18, 1985 Referring Provider (PT): Dr. Clementeen Graham   Encounter Date: 02/19/2020   PT End of Session - 02/19/20 1611    Visit Number 5    Number of Visits 12    Date for PT Re-Evaluation 03/12/20    Progress Note Due on Visit 10    PT Start Time 1550    PT Stop Time 1629    PT Time Calculation (min) 39 min    Activity Tolerance Patient tolerated treatment well    Behavior During Therapy [facility] for tasks assessed/performed           Past Medical History:  Diagnosis Date  . ALLERGIC RHINITIS 06/18/2008  . ANXIETY 06/18/2008   bipolar  . Bipolar 1 disorder (HCC) 07/08/2015  . RASH-NONVESICULAR 06/18/2008  . SINUSITIS- ACUTE-NOS 07/09/2009    History reviewed. No pertinent surgical history.  There were no vitals filed for this visit.   Subjective Assessment - 02/19/20 1610    Subjective Pt. indicated shoulders and upper back were doing better.  Continued complaints noted from walking at [employer] previously and wanted to work on that today.    Pertinent History MD note: Pt is a 34 y/o female presenting w/ c/o midline T-spine pain since 2015 when she felt a pop in her R mid back just medial to her R medial scapula when she was in the shower.  She states that ever since then, she's been having issues w/ her back from mid-back to low back.  She will intermittently have numbness/tingling in her B UEs.    Limitations Sitting;Lifting;Walking;Standing    Diagnostic tests xray    Patient Stated Goals Reduce pain, increase exercise routine    Currently in Pain? Yes    Pain Score 4     Pain Location Back    Pain Orientation Lower    Pain Descriptors / Indicators Sore;Tightness    Pain Type Chronic pain    Pain Onset More than a month ago    Pain Frequency  Intermittent                             [facility] Adult PT Treatment/Exercise - 02/19/20 0001      Lumbar Exercises: Stretches   Single Knee to Chest Stretch 5 reps   5 x 15 seconds bilateral   Lower Trunk Rotation 5 reps   5 x 15 seconds bilateral     Lumbar Exercises: Aerobic   Nustep Lvl 6 10 mins      Lumbar Exercises: Machines for Strengthening   Leg Press 87 lbs 3 x 10 bilateral      Lumbar Exercises: Prone   Other Prone Lumbar Exercises plank on knees to fatigue x 3      Lumbar Exercises: Quadruped   Opposite Arm/Leg Raise Left arm/Right leg;Right arm/Left leg;15 reps      Manual Therapy   Manual therapy comments compression to Lt and Rt lumbar paraspinals                    PT Short Term Goals - 02/11/20 1602      PT SHORT TERM GOAL #1   Title Patient will demonstrate independent use of home exercise program to maintain progress from in clinic treatments.  Time 2    Period Weeks    Status Achieved    Target Date 01/30/20             PT Long Term Goals - 02/11/20 1603      PT LONG TERM GOAL #1   Title Patient will demonstrate/report pain at worst less than or equal to 2/10 to facilitate minimal limitation in daily activity secondary to pain symptoms.    Time 8    Period Weeks    Status On-going    Target Date 03/12/20      PT LONG TERM GOAL #2   Title Patient will demonstrate independent use of home exercise program to facilitate ability to maintain/progress functional gains from skilled physical therapy services.    Time 8    Period Weeks    Status Achieved    Target Date 03/12/20      PT LONG TERM GOAL #3   Title Pt. will demonstrate thoracic AROM WFL s symptoms to facilitate usual daily and recreational mobility at PLOF s limitation.    Time 8    Period Weeks    Status On-going    Target Date 03/12/20      PT LONG TERM GOAL #4   Title Pt. will demonstrate lumbar AROM s symptoms for daily mobility at PLOF.    Time 8     Period Weeks    Status On-going    Target Date 03/12/20      PT LONG TERM GOAL #5   Title Pt. will demonstrate lumbar flexion MMT > or = 4+/5 to facilitate improved stability c daily and recreational activity.    Time 8    Period Weeks    Status On-going    Target Date 03/12/20      PT LONG TERM GOAL #6   Title Pt. will report ability to perform walking, gym for exercise.    Time 8    Period Weeks    Status On-going    Target Date 03/12/20                 Plan - 02/19/20 1622    Clinical Impression Statement Overall slow but steady gains noted in presentation but continued evidence of lower back symptoms primary complaint c increased activity such as walking/house work.  Pt. to continue to benefit from manual intervention for symptom reduction paired c intervention to improve progressive mobility/strength to lumbar/thoracic region.    Examination-Participation Restrictions Meal Prep;Laundry;Cleaning    Stability/Clinical Decision Making Stable/Uncomplicated    Rehab Potential Good    PT Frequency --   1-2x/week   PT Duration 8 weeks    PT Treatment/Interventions ADLs/Self Care Home Management;Cryotherapy;Electrical Stimulation;Iontophoresis 4mg /ml Dexamethasone;Moist Heat;Traction;Balance training;Therapeutic exercise;Therapeutic activities;Functional mobility training;Stair training;Gait training;Ultrasound;Neuromuscular re-education;Patient/family education;Manual techniques;Taping;Dry needling;Passive range of motion;Spinal Manipulations;Joint Manipulations    PT Next Visit Plan Use DN prn, core activation/strengthening, gross exercise improvements.    PT Home Exercise Plan [id number]    Consulted and Agree with Plan of Care Patient           Patient will benefit from skilled therapeutic intervention in order to improve the following deficits and impairments:  Hypomobility, Decreased endurance, Pain, Increased fascial restricitons, Decreased strength, Decreased  activity tolerance, Decreased mobility, Difficulty walking, Increased muscle spasms, Postural dysfunction, Impaired flexibility, Decreased coordination, Decreased range of motion  Visit Diagnosis: Pain in thoracic spine  Abnormal posture  Chronic bilateral low back pain without sciatica  Muscle weakness (generalized)  Difficulty in walking, not  elsewhere classified     Problem List Patient Active Problem List   Diagnosis Date Noted  . Midline thoracic back pain 01/02/2020  . Fatigue 07/08/2015  . Bipolar 1 disorder (HCC) 07/08/2015  . Mild obesity 07/08/2015  . Mid back pain on right side 07/08/2015  . Encounter for well adult exam with abnormal findings 10/15/2010  . Anxiety and depression 06/18/2008  . Allergic rhinitis 06/18/2008    Chyrel Masson, PT, DPT, OCS, ATC 02/19/20  4:26 PM    [facility] Physical Therapy [address] [place], [place], [address] Phone: [phone]   Fax:  [phone]  Name: Cynthia Pham MRN: [id number] Date of Birth: Jul 02, 1985

## 2020-02-26 ENCOUNTER — Encounter: Payer: 59 | Admitting: Rehabilitative and Restorative Service Providers"

## 2020-03-19 ENCOUNTER — Ambulatory Visit (INDEPENDENT_AMBULATORY_CARE_PROVIDER_SITE_OTHER): Payer: 59 | Admitting: Rehabilitative and Restorative Service Providers"

## 2020-03-19 ENCOUNTER — Other Ambulatory Visit: Payer: Self-pay

## 2020-03-19 ENCOUNTER — Encounter: Payer: Self-pay | Admitting: Rehabilitative and Restorative Service Providers"

## 2020-03-19 DIAGNOSIS — R293 Abnormal posture: Secondary | ICD-10-CM | POA: Diagnosis not present

## 2020-03-19 DIAGNOSIS — M545 Low back pain, unspecified: Secondary | ICD-10-CM

## 2020-03-19 DIAGNOSIS — G8929 Other chronic pain: Secondary | ICD-10-CM

## 2020-03-19 DIAGNOSIS — M6281 Muscle weakness (generalized): Secondary | ICD-10-CM | POA: Diagnosis not present

## 2020-03-19 DIAGNOSIS — M546 Pain in thoracic spine: Secondary | ICD-10-CM

## 2020-03-19 DIAGNOSIS — R262 Difficulty in walking, not elsewhere classified: Secondary | ICD-10-CM

## 2020-03-19 NOTE — Therapy (Signed)
[facility] Physical Therapy [address] [place], [place], [address] Phone: [phone]   Fax:  [phone]  Physical Therapy Treatment/Progress Note/Recertification  Patient Details  Name: Cynthia Pham MRN: [id number] Date of Birth: 1985-08-29 Referring Provider (PT): Dr. Clementeen Graham   Encounter Date: 03/19/2020   Progress Note Reporting Period 01/16/2020 to 03/19/2020  See note below for Objective Data and Assessment of Progress/Goals.        PT End of Session - 03/19/20 1544    Visit Number 6    Number of Visits 18    Date for PT Re-Evaluation 05/14/20    Progress Note Due on Visit 16    PT Start Time 1544    PT Stop Time 1625    PT Time Calculation (min) 41 min    Activity Tolerance Patient tolerated treatment well    Behavior During Therapy [facility] for tasks assessed/performed           Past Medical History:  Diagnosis Date   ALLERGIC RHINITIS 06/18/2008   ANXIETY 06/18/2008   bipolar   Bipolar 1 disorder (HCC) 07/08/2015   RASH-NONVESICULAR 06/18/2008   SINUSITIS- ACUTE-NOS 07/09/2009    History reviewed. No pertinent surgical history.  There were no vitals filed for this visit.   Subjective Assessment - 03/19/20 1546    Subjective Pt. indicated things have been going " pretty good" but still having some complaints in lower back, Rt more noted than Lt at times.  Pt. stated still having a "strengthening issue" with prolonged housework activity such as cleaning.  GROC rated at quite a bit better +5.    Pertinent History MD note: Pt is a 34 y/o female presenting w/ c/o midline T-spine pain since 2015 when she felt a pop in her R mid back just medial to her R medial scapula when she was in the shower.  She states that ever since then, she's been having issues w/ her back from mid-back to low back.  She will intermittently have numbness/tingling in her B UEs.    Limitations Sitting;Lifting;Walking;Standing    Diagnostic tests xray    Patient  Stated Goals Reduce pain, increase exercise routine    Currently in Pain? Yes    Pain Score 4    pain at worst 10/10   Pain Location Back    Pain Orientation Lower    Pain Descriptors / Indicators Aching;Tightness    Pain Type Chronic pain    Pain Onset More than a month ago    Aggravating Factors  cleaning, bending    Pain Relieving Factors physical therapy/dry needling, stretching at home at times, rest during activity              [facility] PT Assessment - 03/19/20 0001      Assessment   Medical Diagnosis Thoracic spine pain    Referring Provider (PT) Dr. Clementeen Graham    Onset Date/Surgical Date 06/17/13    Hand Dominance Right      AROM   Overall AROM Comments Cervical and thoracic AROM WFL s symptoms in affected area    Lumbar Flexion to floor no complaints    Lumbar Extension 100 % WFL stiffness, REIS x 5 less stiff      Strength   Overall Strength Comments LE hip flexion MMT bilateral 5/5, knee flexion/extension 5/5      Flexibility   Hamstrings passive SLR 90 no complaints bilateral      Palpation   Palpation comment TrP noted lumbar paraspinals, QL  Lt and Rt                         [facility] Adult PT Treatment/Exercise - 03/19/20 0001      Lumbar Exercises: Aerobic   Nustep Lvl 6 10 mins      Lumbar Exercises: Standing   Other Standing Lumbar Exercises lumbar extension x 5      Manual Therapy   Manual therapy comments compression to Rt lumbar paraspinals, skilled palpation            Trigger Point Dry Needling - 03/19/20 0001    Consent Given? Yes    Education Handout Provided No    Muscles Treated Back/Hip --   Lumbar paraspinals Rt, twitch noted               PT Education - 03/19/20 1549    Education Details Review of HEP    Person(s) Educated Patient    Methods Explanation    Comprehension Verbalized understanding;Returned demonstration            PT Short Term Goals - 02/11/20 1602      PT SHORT TERM GOAL #1   Title  Patient will demonstrate independent use of home exercise program to maintain progress from in clinic treatments.    Time 2    Period Weeks    Status Achieved    Target Date 01/30/20             PT Long Term Goals - 03/19/20 1600      PT LONG TERM GOAL #1   Title Patient will demonstrate/report pain at worst less than or equal to 2/10 to facilitate minimal limitation in daily activity secondary to pain symptoms.    Time 8    Period Weeks    Status Revised    Target Date 05/14/20      PT LONG TERM GOAL #2   Title Patient will demonstrate independent use of home exercise program to facilitate ability to maintain/progress functional gains from skilled physical therapy services.    Time 8    Period Weeks    Status Achieved      PT LONG TERM GOAL #3   Title Pt. will demonstrate thoracic AROM WFL s symptoms to facilitate usual daily and recreational mobility at PLOF s limitation.    Time 8    Period Weeks    Status Achieved    Target Date 05/14/20      PT LONG TERM GOAL #4   Title Pt. will demonstrate lumbar AROM s symptoms for daily mobility at PLOF.    Time 8    Period Weeks    Status On-going    Target Date 05/14/20      PT LONG TERM GOAL #5   Title Pt. will demonstrate lumbar flexion MMT > or = 4+/5 to facilitate improved stability c daily and recreational activity.    Time 8    Period Weeks    Status On-going    Target Date 05/14/20      PT LONG TERM GOAL #6   Title Pt. will report ability to perform walking, gym for exercise.    Time 8    Period Weeks    Status On-going    Target Date 05/14/20                 Plan - 03/19/20 1601    Clinical Impression Statement Pt. has attended 6 visits during course of  treatment, reporting GROC +5 at this time.  Symptoms vary from lower grade to 10/10 still at times per reports. See objective data for updated information regarding current presentation.  Pt. may continue to benefit from skilled PT services to reduced  chronic lower back complaints and improve tolerance and return to PLOF s limitations due to symptoms.    Examination-Participation Restrictions Meal Prep;Laundry;Cleaning    Stability/Clinical Decision Making Stable/Uncomplicated    Rehab Potential Good    PT Frequency --   1-2x/week   PT Duration 8 weeks    PT Treatment/Interventions ADLs/Self Care Home Management;Cryotherapy;Electrical Stimulation;Iontophoresis 4mg /ml Dexamethasone;Moist Heat;Traction;Balance training;Therapeutic exercise;Therapeutic activities;Functional mobility training;Stair training;Gait training;Ultrasound;Neuromuscular re-education;Patient/family education;Manual techniques;Taping;Dry needling;Passive range of motion;Spinal Manipulations;Joint Manipulations    PT Next Visit Plan Use DN prn, core activation/strengthening, gross exercise improvements.    PT Home Exercise Plan [phone]    Consulted and Agree with Plan of Care Patient           Patient will benefit from skilled therapeutic intervention in order to improve the following deficits and impairments:  Hypomobility, Decreased endurance, Pain, Increased fascial restricitons, Decreased strength, Decreased activity tolerance, Decreased mobility, Difficulty walking, Increased muscle spasms, Postural dysfunction, Impaired flexibility, Decreased coordination, Decreased range of motion  Visit Diagnosis: Chronic bilateral low back pain without sciatica  Muscle weakness (generalized)  Abnormal posture  Pain in thoracic spine  Difficulty in walking, not elsewhere classified     Problem List Patient Active Problem List   Diagnosis Date Noted   Midline thoracic back pain 01/02/2020   Fatigue 07/08/2015   Bipolar 1 disorder (HCC) 07/08/2015   Mild obesity 07/08/2015   Mid back pain on right side 07/08/2015   Encounter for well adult exam with abnormal findings 10/15/2010   Anxiety and depression 06/18/2008   Allergic rhinitis 06/18/2008   06/20/2008, PT, DPT, OCS, ATC 03/19/20  4:19 PM    [facility] Physical Therapy [address] [place], [place], [place] Phone: [phone]   Fax:  [phone]  Name: CASSIA FEIN MRN: Marcella Dubs Date of Birth: 1985-09-14

## 2020-03-26 ENCOUNTER — Encounter: Payer: 59 | Admitting: Rehabilitative and Restorative Service Providers"

## 2020-04-02 ENCOUNTER — Ambulatory Visit (INDEPENDENT_AMBULATORY_CARE_PROVIDER_SITE_OTHER): Payer: 59 | Admitting: Rehabilitative and Restorative Service Providers"

## 2020-04-02 ENCOUNTER — Other Ambulatory Visit: Payer: Self-pay

## 2020-04-02 ENCOUNTER — Encounter: Payer: Self-pay | Admitting: Rehabilitative and Restorative Service Providers"

## 2020-04-02 DIAGNOSIS — M545 Low back pain, unspecified: Secondary | ICD-10-CM

## 2020-04-02 DIAGNOSIS — M546 Pain in thoracic spine: Secondary | ICD-10-CM

## 2020-04-02 DIAGNOSIS — G8929 Other chronic pain: Secondary | ICD-10-CM

## 2020-04-02 DIAGNOSIS — M6281 Muscle weakness (generalized): Secondary | ICD-10-CM | POA: Diagnosis not present

## 2020-04-02 DIAGNOSIS — R293 Abnormal posture: Secondary | ICD-10-CM

## 2020-04-02 DIAGNOSIS — R262 Difficulty in walking, not elsewhere classified: Secondary | ICD-10-CM

## 2020-04-02 NOTE — Therapy (Signed)
[facility] Physical Therapy [address] [place], [place], [address] Phone: [phone]   Fax:  [phone]  Physical Therapy Treatment  Patient Details  Name: Cynthia Pham MRN: [id number] Date of Birth: [birth year] Referring Provider (PT): Dr. Clementeen Graham   Encounter Date: 04/02/2020   PT End of Session - 04/02/20 1502    Visit Number 7    Number of Visits 18    Date for PT Re-Evaluation 05/14/20    Progress Note Due on Visit 16    PT Start Time 1457    PT Stop Time 1538    PT Time Calculation (min) 41 min    Activity Tolerance Patient tolerated treatment well    Behavior During Therapy [facility] for tasks assessed/performed           Past Medical History:  Diagnosis Date  . ALLERGIC RHINITIS 06/18/2008  . ANXIETY 06/18/2008   bipolar  . Bipolar 1 disorder (HCC) 07/08/2015  . RASH-NONVESICULAR 06/18/2008  . SINUSITIS- ACUTE-NOS 07/09/2009    History reviewed. No pertinent surgical history.  There were no vitals filed for this visit.   Subjective Assessment - 04/02/20 1500    Subjective Pt. stated lower back continued and some occasional complaints in shoulder c turning.  Pt. stated Rt lower back feels so and bruise like feel.  Overall still doing better than initially but symptoms are still present. 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    Pertinent History MD note: Pt is a 34 y/o female presenting w/ c/o midline T-spine pain since 2015 when she felt a pop in her R mid back just medial to her R medial scapula when she was in the shower.  She states that ever since then, she's been having issues w/ her back from mid-back to low back.  She will intermittently have numbness/tingling in her B UEs.    Limitations Sitting;Lifting;Walking;Standing    Diagnostic tests xray    Patient Stated Goals Reduce pain, increase exercise routine    Currently in Pain? Yes    Pain Score 3      Pain Location Back    Pain Orientation Lower;Right    Pain Descriptors / Indicators Sore;Aching    Pain Type Chronic pain    Pain Radiating Towards Rt posterior buttock/leg at times    Pain Onset More than a month ago    Pain Frequency Intermittent    Aggravating Factors  prolonged standing/walking                             [facility] Adult PT Treatment/Exercise - 04/02/20 0001      Lumbar Exercises: Aerobic   UBE (Upper Arm Bike) UE/LE lvl 6 10 mins      Lumbar Exercises: Machines for Strengthening   Leg Press 100 lbs 3 x 10 bilateral      Shoulder Exercises: ROM/Strengthening   Lat Pull Other (comment)   3 x 12 25 lbs   Cybex Row Other (comment)   3 x 12 25 lbs     Manual Therapy   Manual therapy comments compression bilateral infraspinatus, Rt QL, skilled palpation to each c DN            Trigger Point Dry Needling - 04/02/20 0001    Consent Given? Yes    Education Handout Provided Previously provided    Muscles  Treated Upper Quadrant Infraspinatus    Muscles Treated Back/Hip Quadratus lumborum   paraspinals Rt   Infraspinatus Response Twitch response elicited    Quadratus Lumborum Response Twitch response elicited                  PT Short Term Goals - 02/11/20 1602      PT SHORT TERM GOAL #1   Title Patient will demonstrate independent use of home exercise program to maintain progress from in clinic treatments.    Time 2    Period Weeks    Status Achieved    Target Date 01/30/20             PT Long Term Goals - 03/19/20 1600      PT LONG TERM GOAL #1   Title Patient will demonstrate/report pain at worst less than or equal to 2/10 to facilitate minimal limitation in daily activity secondary to pain symptoms.    Time 8    Period Weeks    Status Revised    Target Date 05/14/20      PT LONG TERM GOAL #2   Title Patient will demonstrate independent use of home exercise program to facilitate ability to maintain/progress functional  gains from skilled physical therapy services.    Time 8    Period Weeks    Status Achieved      PT LONG TERM GOAL #3   Title Pt. will demonstrate thoracic AROM WFL s symptoms to facilitate usual daily and recreational mobility at PLOF s limitation.    Time 8    Period Weeks    Status Achieved    Target Date 05/14/20      PT LONG TERM GOAL #4   Title Pt. will demonstrate lumbar AROM s symptoms for daily mobility at PLOF.    Time 8    Period Weeks    Status On-going    Target Date 05/14/20      PT LONG TERM GOAL #5   Title Pt. will demonstrate lumbar flexion MMT > or = 4+/5 to facilitate improved stability c daily and recreational activity.    Time 8    Period Weeks    Status On-going    Target Date 05/14/20      PT LONG TERM GOAL #6   Title Pt. will report ability to perform walking, gym for exercise.    Time 8    Period Weeks    Status On-going    Target Date 05/14/20                 Plan - 04/02/20 1526    Clinical Impression Statement Noted benefit in thoracic/posterior shoulder pains post manual intervention.  Focus on intervention for graded exercise return and tolerance.  Pt. may continue to benefit from lower lumbar mobility gains.    Examination-Participation Restrictions Meal Prep;Laundry;Cleaning    Stability/Clinical Decision Making Stable/Uncomplicated    Rehab Potential Good    PT Frequency --   1-2x/week   PT Duration 8 weeks    PT Treatment/Interventions ADLs/Self Care Home Management;Cryotherapy;Electrical Stimulation;Iontophoresis 4mg /ml Dexamethasone;Moist Heat;Traction;Balance training;Therapeutic exercise;Therapeutic activities;Functional mobility training;Stair training;Gait training;Ultrasound;Neuromuscular re-education;Patient/family education;Manual techniques;Taping;Dry needling;Passive range of motion;Spinal Manipulations;Joint Manipulations    PT Next Visit Plan Use DN prn, core activation/strengthening, gross exercise improvements.    PT  Home Exercise Plan [phone]    Consulted and Agree with Plan of Care Patient           Patient will benefit from skilled therapeutic intervention  in order to improve the following deficits and impairments:  Hypomobility, Decreased endurance, Pain, Increased fascial restricitons, Decreased strength, Decreased activity tolerance, Decreased mobility, Difficulty walking, Increased muscle spasms, Postural dysfunction, Impaired flexibility, Decreased coordination, Decreased range of motion  Visit Diagnosis: Chronic bilateral low back pain without sciatica  Muscle weakness (generalized)  Abnormal posture  Pain in thoracic spine  Difficulty in walking, not elsewhere classified     Problem List Patient Active Problem List   Diagnosis Date Noted  . Midline thoracic back pain 01/02/2020  . Fatigue 07/08/2015  . Bipolar 1 disorder (HCC) 07/08/2015  . Mild obesity 07/08/2015  . Mid back pain on right side 07/08/2015  . Encounter for well adult exam with abnormal findings 10/15/2010  . Anxiety and depression 06/18/2008  . Allergic rhinitis 06/18/2008    Chyrel Masson, PT, DPT, OCS, ATC 04/02/20  3:36 PM    [facility] Physical Therapy [address] [place], [place], [address] Phone: [phone]   Fax:  [phone]  Name: Cynthia Pham MRN: [id number] Date of Birth: [birth year]

## 2020-04-09 ENCOUNTER — Encounter: Payer: 59 | Admitting: Rehabilitative and Restorative Service Providers"

## 2020-04-16 ENCOUNTER — Other Ambulatory Visit: Payer: Self-pay

## 2020-04-16 ENCOUNTER — Encounter: Payer: Self-pay | Admitting: Rehabilitative and Restorative Service Providers"

## 2020-04-16 ENCOUNTER — Ambulatory Visit (INDEPENDENT_AMBULATORY_CARE_PROVIDER_SITE_OTHER): Payer: 59 | Admitting: Rehabilitative and Restorative Service Providers"

## 2020-04-16 DIAGNOSIS — R293 Abnormal posture: Secondary | ICD-10-CM | POA: Diagnosis not present

## 2020-04-16 DIAGNOSIS — M545 Low back pain, unspecified: Secondary | ICD-10-CM

## 2020-04-16 DIAGNOSIS — M6281 Muscle weakness (generalized): Secondary | ICD-10-CM | POA: Diagnosis not present

## 2020-04-16 DIAGNOSIS — M546 Pain in thoracic spine: Secondary | ICD-10-CM

## 2020-04-16 DIAGNOSIS — G8929 Other chronic pain: Secondary | ICD-10-CM

## 2020-04-16 DIAGNOSIS — R262 Difficulty in walking, not elsewhere classified: Secondary | ICD-10-CM

## 2020-04-16 NOTE — Therapy (Addendum)
[facility] Physical Therapy [address] [place], [place], [address] Phone: [phone]   Fax:  [phone]  Physical Therapy Treatment/Discharge  Patient Details  Name: Cynthia Pham MRN: [id number] Date of Birth: [birth year] Referring Provider (PT): Dr. Lynne Leader   Encounter Date: 04/16/2020   PT End of Session - 04/16/20 1516    Visit Number 8    Number of Visits 18    Date for PT Re-Evaluation 05/14/20    Progress Note Due on Visit 16    PT Start Time 1513    PT Stop Time 1557    PT Time Calculation (min) 44 min    Activity Tolerance Patient tolerated treatment well    Behavior During Therapy [facility] for tasks assessed/performed           Past Medical History:  Diagnosis Date  . ALLERGIC RHINITIS 06/18/2008  . ANXIETY 06/18/2008   bipolar  . Bipolar 1 disorder ([place]) 07/08/2015  . RASH-NONVESICULAR 06/18/2008  . SINUSITIS- ACUTE-NOS 07/09/2009    History reviewed. No pertinent surgical history.  There were no vitals filed for this visit.   Subjective Assessment - 04/16/20 1517    Subjective Pt. indicated not feeling too bad overall with some voiced positivity about condition.  Pt. indicated some symptoms on Rt side from time to time noted.  Pt. self identified that static positioning plays a role.    Pertinent History MD note: Pt is a 34 y/o female presenting w/ c/o midline T-spine pain since 2015 when she felt a pop in her R mid back just medial to her R medial scapula when she was in the shower.  She states that ever since then, she's been having issues w/ her back from mid-back to low back.  She will intermittently have numbness/tingling in her B UEs.    Limitations Sitting;Lifting;Walking;Standing    Diagnostic tests xray    Patient Stated Goals Reduce pain, increase exercise routine    Pain Location Back    Pain Orientation Lower;Right    Pain Onset More than a month ago    Aggravating Factors  prolonged sitting, walking, prolonged activity     Pain Relieving Factors stretching, HEP    Effect of Pain on Daily Activities prolonged walking, home activity                             [facility] Adult PT Treatment/Exercise - 04/16/20 0001      Neuro Re-ed    Neuro Re-ed Details  Pain neuroscience education provided to patient c focus on central sensitization theory c visual graph representation of neural response to stimuli, dicussion on activity tolerance building to reduced sensitivity and improve function.  Education for neural cerebral mapping and laterality training education for sharpening mapping and reducing sensitivity.   Online website laterality training performed 20 questions on low back identification and shoulder identification (back 16/20, shoulder 13/20 right)   [email or web address]     Exercises   Other Exercises  HEP review performed today      Lumbar Exercises: Aerobic   UBE (Upper Arm Bike) LvL 6 15 mins                  PT Education - 04/16/20 1551    Education Details Body mechanics discussion performed for sitting and daily house activity.  See neuro-re education section    Person(s) Educated Patient    Methods Explanation;Verbal cues    Comprehension Verbalized understanding  PT Short Term Goals - 02/11/20 1602      PT SHORT TERM GOAL #1   Title Patient will demonstrate independent use of home exercise program to maintain progress from in clinic treatments.    Time 2    Period Weeks    Status Achieved    Target Date 01/30/20             PT Long Term Goals - 03/19/20 1600      PT LONG TERM GOAL #1   Title Patient will demonstrate/report pain at worst less than or equal to 2/10 to facilitate minimal limitation in daily activity secondary to pain symptoms.    Time 8    Period Weeks    Status Revised    Target Date 05/14/20      PT LONG TERM GOAL #2   Title Patient will demonstrate independent use of home exercise program to facilitate  ability to maintain/progress functional gains from skilled physical therapy services.    Time 8    Period Weeks    Status Achieved      PT LONG TERM GOAL #3   Title Pt. will demonstrate thoracic AROM WFL s symptoms to facilitate usual daily and recreational mobility at PLOF s limitation.    Time 8    Period Weeks    Status Achieved    Target Date 05/14/20      PT LONG TERM GOAL #4   Title Pt. will demonstrate lumbar AROM s symptoms for daily mobility at PLOF.    Time 8    Period Weeks    Status On-going    Target Date 05/14/20      PT LONG TERM GOAL #5   Title Pt. will demonstrate lumbar flexion MMT > or = 4+/5 to facilitate improved stability c daily and recreational activity.    Time 8    Period Weeks    Status On-going    Target Date 05/14/20      PT LONG TERM GOAL #6   Title Pt. will report ability to perform walking, gym for exercise.    Time 8    Period Weeks    Status On-going    Target Date 05/14/20                 Plan - 04/16/20 1549    Clinical Impression Statement Generally improved outlook on symptom presentation reported by Pt., with symptoms still noted but reduced severity and irritability.  Extended time spent today c Pt. education on neuro science and laterality training, HEP review for technique sto continue to reduce sensitivity to movement.  Body mechanics discussion performed for sitting and daily house activity.    Examination-Participation Restrictions Meal Prep;Laundry;Cleaning    Stability/Clinical Decision Making Stable/Uncomplicated    Rehab Potential Good    PT Frequency --   1-2x/week   PT Duration 8 weeks    PT Treatment/Interventions ADLs/Self Care Home Management;Cryotherapy;Electrical Stimulation;Iontophoresis 45m/ml Dexamethasone;Moist Heat;Traction;Balance training;Therapeutic exercise;Therapeutic activities;Functional mobility training;Stair training;Gait training;Ultrasound;Neuromuscular re-education;Patient/family  education;Manual techniques;Taping;Dry needling;Passive range of motion;Spinal Manipulations;Joint Manipulations    PT Next Visit Plan DN prn for symptom relief, continue progressive activity tolerance gains, follow up on laterality training for home.    PT Home Exercise Plan [phone]   Consulted and Agree with Plan of Care Patient           Patient will benefit from skilled therapeutic intervention in order to improve the following deficits and impairments:  Hypomobility, Decreased endurance, Pain, Increased fascial  restricitons, Decreased strength, Decreased activity tolerance, Decreased mobility, Difficulty walking, Increased muscle spasms, Postural dysfunction, Impaired flexibility, Decreased coordination, Decreased range of motion  Visit Diagnosis: Chronic bilateral low back pain without sciatica  Muscle weakness (generalized)  Abnormal posture  Pain in thoracic spine  Difficulty in walking, not elsewhere classified     Problem List Patient Active Problem List   Diagnosis Date Noted  . Midline thoracic back pain 01/02/2020  . Fatigue 07/08/2015  . Bipolar 1 disorder ([place]) 07/08/2015  . Mild obesity 07/08/2015  . Mid back pain on right side 07/08/2015  . Encounter for well adult exam with abnormal findings 10/15/2010  . Anxiety and depression 06/18/2008  . Allergic rhinitis 06/18/2008    Scot Jun, PT, DPT, OCS, ATC 04/16/20  3:52 PM   PHYSICAL THERAPY DISCHARGE SUMMARY  Visits from Start of Care: 8  Current functional level related to goals / functional outcomes: See note   Remaining deficits: See note   Education / Equipment: HEP  Plan: Patient agrees to discharge.  Patient goals were partially met. Patient is being discharged due to not returning since the last visit.  ?????    Scot Jun, PT, DPT, OCS, ATC 05/18/20  2:30 PM     [facility] [address] [place], [place], [address] Phone:  [phone]   Fax:  [phone]  Name: Cynthia Pham MRN: [id number] Date of Birth: [birth year]

## 2020-04-22 ENCOUNTER — Encounter: Payer: 59 | Admitting: Rehabilitative and Restorative Service Providers"

## 2020-04-30 ENCOUNTER — Encounter: Payer: 59 | Admitting: Rehabilitative and Restorative Service Providers"

## 2021-11-24 IMAGING — DX DG THORACIC SPINE 2V
3 series · 3 of 3 positions shown · non-contrast
Comparison: None.

CLINICAL DATA: Thoracic pain

EXAM:
THORACIC SPINE 2 VIEWS

[t-spine ap]
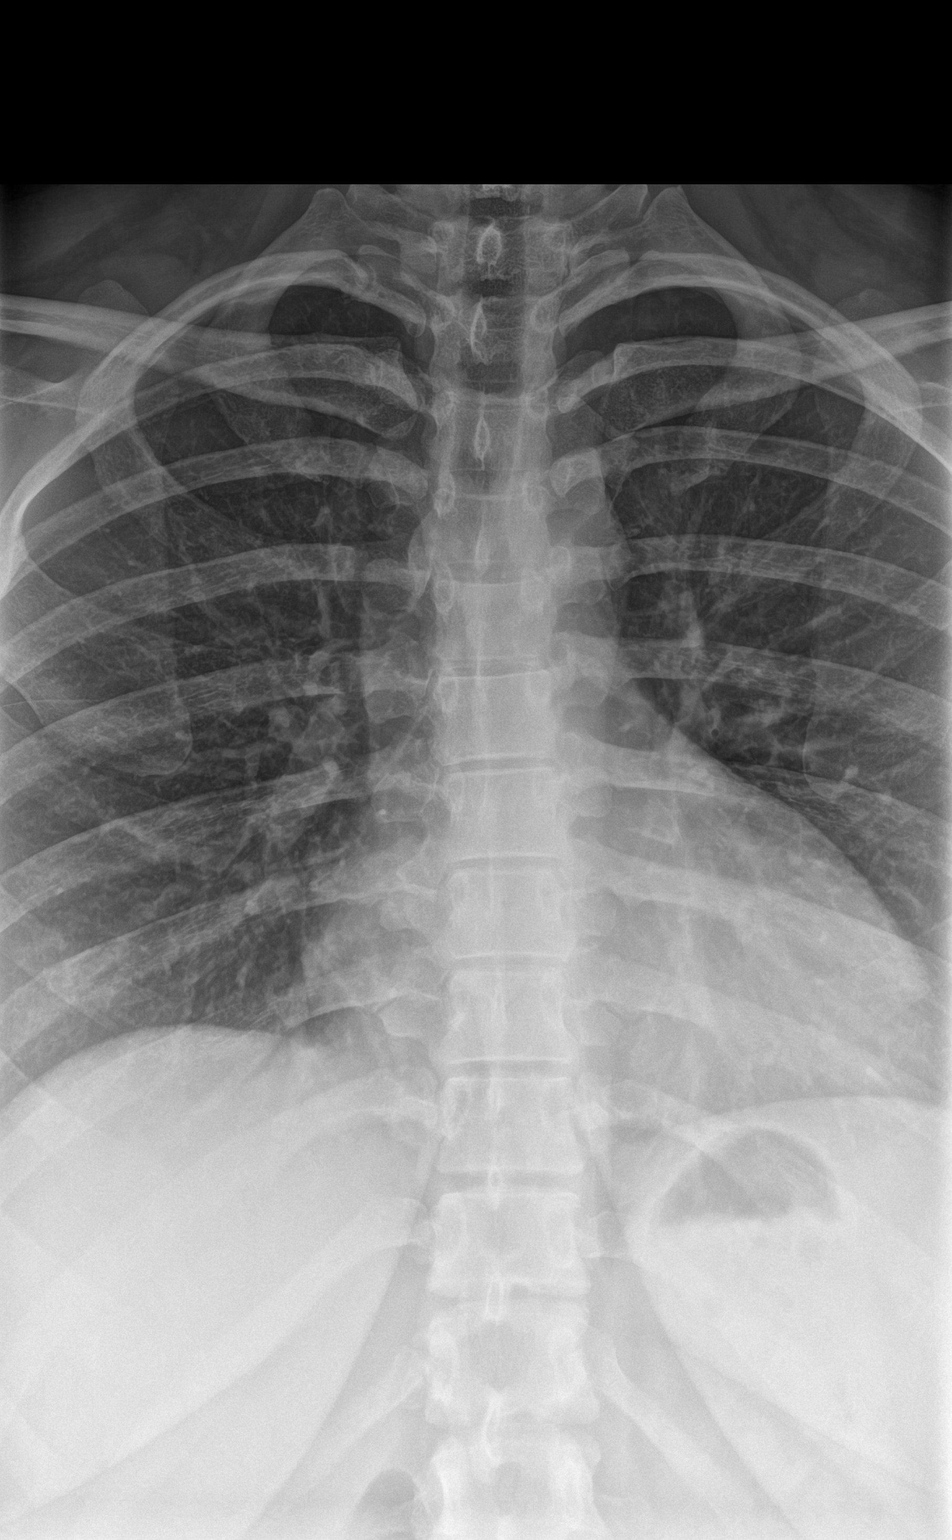

[t-spine lat]
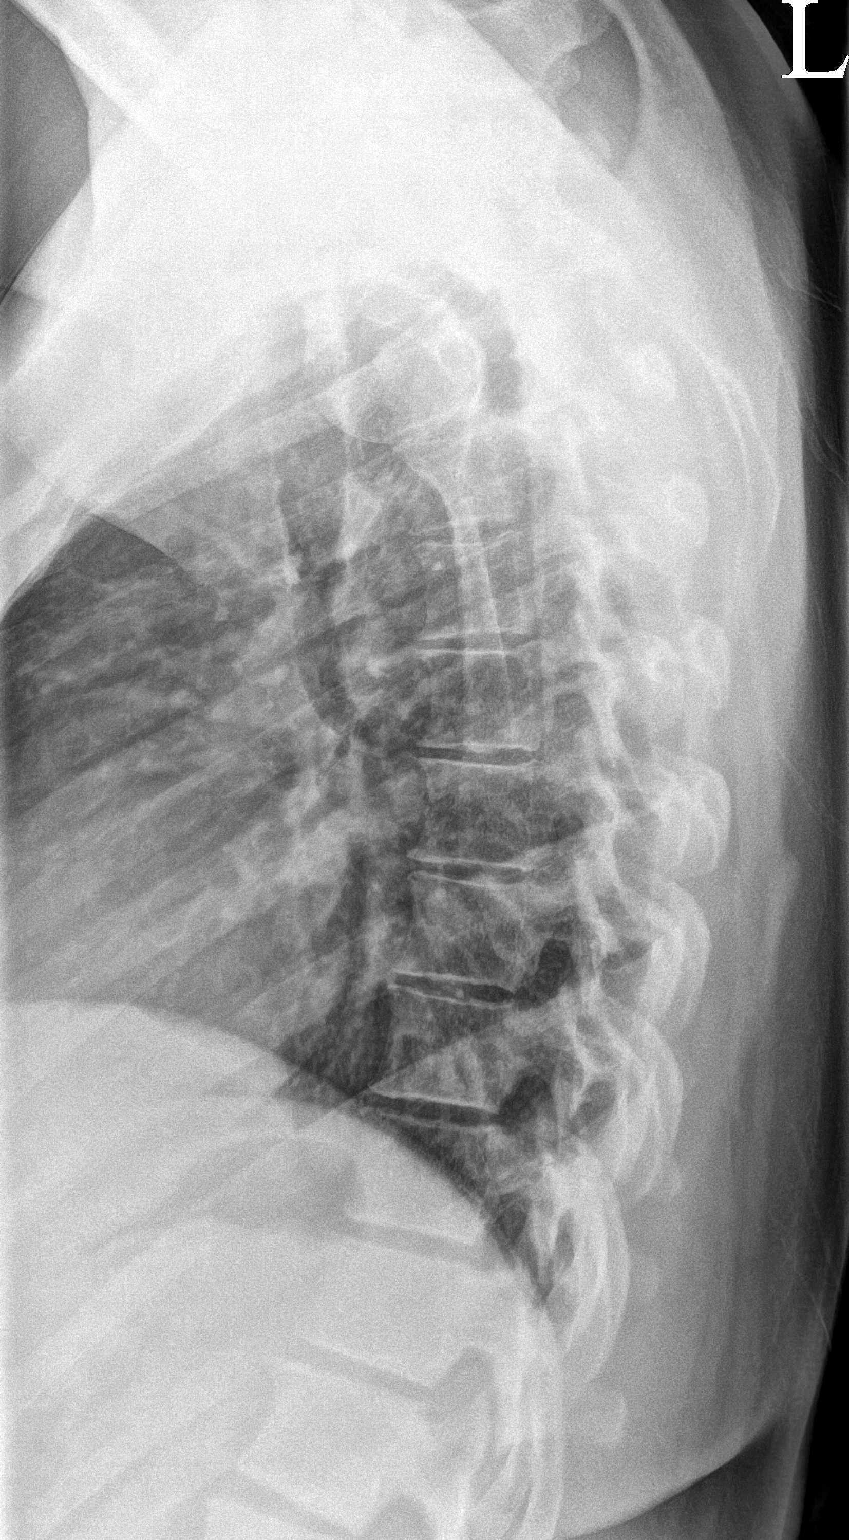

[ct-spine swimmers]
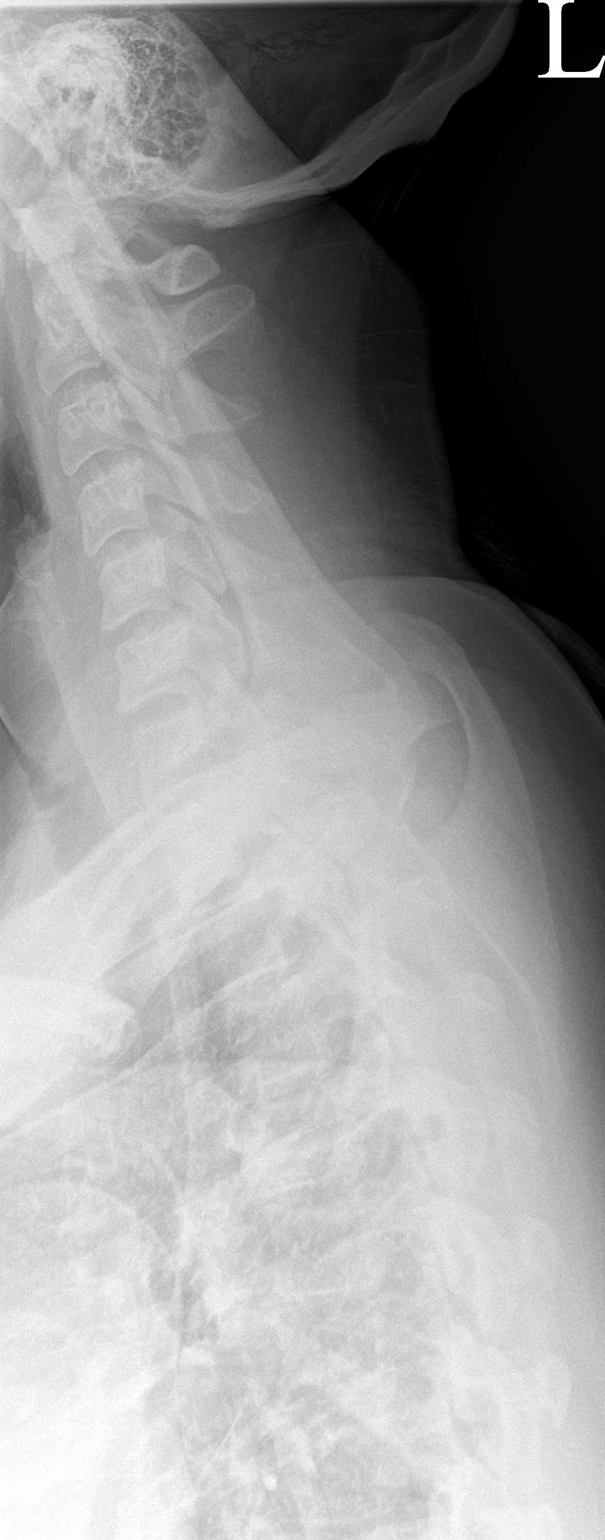

[3 of 3 positions shown; findings below may reference images not displayed]

FINDINGS: Minimal scoliosis. Sagittal alignment is normal. Vertebral body
heights and disc spaces are maintained.
IMPRESSION: Minimal scoliosis. No acute osseous abnormality.
